# Patient Record
Sex: Female | Born: 1970
Health system: Southern US, Community
[De-identification: ages and names within clinical notes are randomized; demographics above are authoritative.]

## PROBLEM LIST (undated history)

## (undated) DIAGNOSIS — F319 Bipolar disorder, unspecified: Secondary | ICD-10-CM

## (undated) DIAGNOSIS — K449 Diaphragmatic hernia without obstruction or gangrene: Secondary | ICD-10-CM

## (undated) DIAGNOSIS — J302 Other seasonal allergic rhinitis: Secondary | ICD-10-CM

## (undated) DIAGNOSIS — E669 Obesity, unspecified: Secondary | ICD-10-CM

## (undated) DIAGNOSIS — F988 Other specified behavioral and emotional disorders with onset usually occurring in childhood and adolescence: Secondary | ICD-10-CM

## (undated) HISTORY — PX: CHOLECYSTECTOMY: SHX55

---

## 2009-07-21 ENCOUNTER — Emergency Department (HOSPITAL_COMMUNITY): Admission: EM | Admit: 2009-07-21 | Discharge: 2009-07-21 | Payer: Self-pay | Admitting: Emergency Medicine

## 2010-08-01 LAB — DIFFERENTIAL
Lymphs Abs: 2.1 10*3/uL (ref 0.7–4.0)
Monocytes Absolute: 0.4 10*3/uL (ref 0.1–1.0)
Monocytes Relative: 4 % (ref 3–12)
Neutrophils Relative %: 69 % (ref 43–77)

## 2010-08-01 LAB — POCT I-STAT, CHEM 8
BUN: 16 mg/dL (ref 6–23)
Calcium, Ion: 1.15 mmol/L (ref 1.12–1.32)
Chloride: 107 mEq/L (ref 96–112)
Creatinine, Ser: 0.8 mg/dL (ref 0.4–1.2)
Glucose, Bld: 110 mg/dL — ABNORMAL HIGH (ref 70–99)
Hemoglobin: 14.3 g/dL (ref 12.0–15.0)
Potassium: 3.9 mEq/L (ref 3.5–5.1)
Sodium: 140 mEq/L (ref 135–145)

## 2010-08-01 LAB — LIPASE, BLOOD: Lipase: 28 U/L (ref 11–59)

## 2010-08-01 LAB — CBC
HCT: 40.6 % (ref 36.0–46.0)
Hemoglobin: 14 g/dL (ref 12.0–15.0)
MCV: 92.2 fL (ref 78.0–100.0)
Platelets: 271 10*3/uL (ref 150–400)
RBC: 4.41 MIL/uL (ref 3.87–5.11)
WBC: 8.4 10*3/uL (ref 4.0–10.5)

## 2010-08-01 LAB — URINALYSIS, ROUTINE W REFLEX MICROSCOPIC: Ketones, ur: NEGATIVE mg/dL

## 2010-08-01 LAB — COMPREHENSIVE METABOLIC PANEL
ALT: 21 U/L (ref 0–35)
Alkaline Phosphatase: 82 U/L (ref 39–117)
BUN: 13 mg/dL (ref 6–23)
CO2: 24 mEq/L (ref 19–32)
GFR calc Af Amer: >60 mL/min (ref >60)
GFR calc non Af Amer: >60 mL/min (ref >60)
Sodium: 136 mEq/L (ref 135–145)

## 2014-10-10 ENCOUNTER — Encounter (HOSPITAL_COMMUNITY): Payer: Self-pay | Admitting: *Deleted

## 2014-10-10 ENCOUNTER — Emergency Department (HOSPITAL_COMMUNITY)
Admission: EM | Admit: 2014-10-10 | Discharge: 2014-10-10 | Disposition: A | Discharge status: Home Health | Payer: Self-pay | Attending: Emergency Medicine | Admitting: Emergency Medicine

## 2014-10-10 ENCOUNTER — Emergency Department (HOSPITAL_COMMUNITY): Payer: Self-pay

## 2014-10-10 DIAGNOSIS — Z8719 Personal history of other diseases of the digestive system: Secondary | ICD-10-CM | POA: Insufficient documentation

## 2014-10-10 DIAGNOSIS — Z3202 Encounter for pregnancy test, result negative: Secondary | ICD-10-CM | POA: Insufficient documentation

## 2014-10-10 DIAGNOSIS — F909 Attention-deficit hyperactivity disorder, unspecified type: Secondary | ICD-10-CM | POA: Insufficient documentation

## 2014-10-10 DIAGNOSIS — N83201 Unspecified ovarian cyst, right side: Secondary | ICD-10-CM

## 2014-10-10 DIAGNOSIS — Z720 Tobacco use: Secondary | ICD-10-CM | POA: Insufficient documentation

## 2014-10-10 DIAGNOSIS — E669 Obesity, unspecified: Secondary | ICD-10-CM | POA: Insufficient documentation

## 2014-10-10 DIAGNOSIS — Z79899 Other long term (current) drug therapy: Secondary | ICD-10-CM | POA: Insufficient documentation

## 2014-10-10 DIAGNOSIS — F319 Bipolar disorder, unspecified: Secondary | ICD-10-CM | POA: Insufficient documentation

## 2014-10-10 DIAGNOSIS — N8320 Unspecified ovarian cysts: Secondary | ICD-10-CM | POA: Insufficient documentation

## 2014-10-10 DIAGNOSIS — N12 Tubulo-interstitial nephritis, not specified as acute or chronic: Secondary | ICD-10-CM | POA: Insufficient documentation

## 2014-10-10 DIAGNOSIS — R109 Unspecified abdominal pain: Secondary | ICD-10-CM

## 2014-10-10 HISTORY — DX: Other seasonal allergic rhinitis: J30.2

## 2014-10-10 HISTORY — DX: Bipolar disorder, unspecified: F31.9

## 2014-10-10 HISTORY — DX: Obesity, unspecified: E66.9

## 2014-10-10 HISTORY — DX: Diaphragmatic hernia without obstruction or gangrene: K44.9

## 2014-10-10 HISTORY — DX: Other specified behavioral and emotional disorders with onset usually occurring in childhood and adolescence: F98.8

## 2014-10-10 LAB — CBC WITH DIFFERENTIAL/PLATELET
BASOS ABS: 0 10*3/uL (ref 0.0–0.1)
Basophils Relative: 0 % (ref 0–1)
Eosinophils Absolute: 0 10*3/uL (ref 0.0–0.7)
Eosinophils Relative: 0 % (ref 0–5)
HEMATOCRIT: 37.7 % (ref 36.0–46.0)
HEMOGLOBIN: 12.6 g/dL (ref 12.0–15.0)
LYMPHS ABS: 1.4 10*3/uL (ref 0.7–4.0)
LYMPHS PCT: 15 % (ref 12–46)
MCH: 29.9 pg (ref 26.0–34.0)
MCHC: 33.4 g/dL (ref 30.0–36.0)
MCV: 89.3 fL (ref 78.0–100.0)
Monocytes Absolute: 0.6 10*3/uL (ref 0.1–1.0)
Monocytes Relative: 6 % (ref 3–12)
NEUTROS ABS: 7.6 10*3/uL (ref 1.7–7.7)
Neutrophils Relative %: 79 % — ABNORMAL HIGH (ref 43–77)
Platelets: 299 10*3/uL (ref 150–400)
RBC: 4.22 MIL/uL (ref 3.87–5.11)
RDW: 12.8 % (ref 11.5–15.5)
WBC: 9.6 10*3/uL (ref 4.0–10.5)

## 2014-10-10 LAB — COMPREHENSIVE METABOLIC PANEL
ALBUMIN: 3.4 g/dL — AB (ref 3.5–5.0)
ALK PHOS: 81 U/L (ref 38–126)
ALT: 15 U/L (ref 14–54)
ANION GAP: 10 (ref 5–15)
AST: 16 U/L (ref 15–41)
BUN: 8 mg/dL (ref 6–20)
CO2: 23 mmol/L (ref 22–32)
CREATININE: 0.9 mg/dL (ref 0.44–1.00)
Calcium: 8.8 mg/dL — ABNORMAL LOW (ref 8.9–10.3)
Chloride: 102 mmol/L (ref 101–111)
GFR calc Af Amer: >60 mL/min (ref >60)
GLUCOSE: 116 mg/dL — AB (ref 65–99)
Potassium: 4 mmol/L (ref 3.5–5.1)
Sodium: 135 mmol/L (ref 135–145)
TOTAL PROTEIN: 7.2 g/dL (ref 6.5–8.1)
Total Bilirubin: 0.6 mg/dL (ref 0.3–1.2)

## 2014-10-10 LAB — URINE MICROSCOPIC-ADD ON

## 2014-10-10 LAB — URINALYSIS, ROUTINE W REFLEX MICROSCOPIC
Bilirubin Urine: NEGATIVE
Glucose, UA: NEGATIVE mg/dL
KETONES UR: NEGATIVE mg/dL
Nitrite: NEGATIVE
PROTEIN: NEGATIVE mg/dL
Specific Gravity, Urine: 1.009 (ref 1.005–1.030)
UROBILINOGEN UA: 0.2 mg/dL (ref 0.0–1.0)
pH: 6 (ref 5.0–8.0)

## 2014-10-10 LAB — POC URINE PREG, ED: PREG TEST UR: NEGATIVE

## 2014-10-10 MED ORDER — FLUCONAZOLE 150 MG PO TABS: 150.0000 mg | ORAL_TABLET | Freq: Once | ORAL | Status: AC

## 2014-10-10 MED ORDER — CEPHALEXIN 500 MG PO CAPS: 500.0000 mg | ORAL_CAPSULE | Freq: Four times a day (QID) | ORAL | Status: DC

## 2014-10-10 MED ORDER — ONDANSETRON HCL 4 MG/2ML IJ SOLN
4.0000 mg | Freq: Once | INTRAMUSCULAR | Status: AC
  Administered 2014-10-10: 4 mg via INTRAVENOUS
  Filled 2014-10-10: qty 2

## 2014-10-10 MED ORDER — ONDANSETRON 4 MG PO TBDP: ORAL_TABLET | ORAL | Status: AC

## 2014-10-10 MED ORDER — HYDROCODONE-ACETAMINOPHEN 5-325 MG PO TABS: 1.0000 | ORAL_TABLET | Freq: Four times a day (QID) | ORAL | Status: AC | PRN

## 2014-10-10 MED ORDER — MORPHINE SULFATE 4 MG/ML IJ SOLN
4.0000 mg | Freq: Once | INTRAMUSCULAR | Status: AC
  Administered 2014-10-10: 4 mg via INTRAVENOUS
  Filled 2014-10-10: qty 1

## 2014-10-10 MED ORDER — SODIUM CHLORIDE 0.9 % IV BOLUS (SEPSIS)
1000.0000 mL | Freq: Once | INTRAVENOUS | Status: AC
  Administered 2014-10-10: 1000 mL via INTRAVENOUS

## 2014-10-10 MED ORDER — DEXTROSE 5 % IV SOLN
1.0000 g | Freq: Once | INTRAVENOUS | Status: AC
  Administered 2014-10-10: 1 g via INTRAVENOUS
  Filled 2014-10-10: qty 10

## 2014-10-10 NOTE — Discharge Instructions (Signed)
1. Medications: zofran, vicodin, keflex, usual home medications 2. Treatment: rest, drink plenty of fluids, advance diet slowly 3. Follow Up: Please followup with your primary doctor in 2 days for discussion of your diagnoses and further evaluation after today's visit; if you do not have a primary care doctor use the resource guide provided to find one; please also follow-up with OB/GYN for further evaluation of your right ovarian cyst; Please return to the ER for persistent vomiting, high fevers or worsening symptoms   Pyelonephritis, Adult Pyelonephritis is a kidney infection. In general, there are 2 main types of pyelonephritis:  Infections that come on quickly without any warning (acute pyelonephritis).  Infections that persist for a long period of time (chronic pyelonephritis). CAUSES  Two main causes of pyelonephritis are:  Bacteria traveling from the bladder to the kidney. This is a problem especially in pregnant women. The urine in the bladder can become filled with bacteria from multiple causes, including:  Inflammation of the prostate gland (prostatitis).  Sexual intercourse in females.  Bladder infection (cystitis).  Bacteria traveling from the bloodstream to the tissue part of the kidney. Problems that may increase your risk of getting a kidney infection include:  Diabetes.  Kidney stones or bladder stones.  Cancer.  Catheters placed in the bladder.  Other abnormalities of the kidney or ureter. SYMPTOMS   Abdominal pain.  Pain in the side or flank area.  Fever.  Chills.  Upset stomach.  Blood in the urine (dark urine).  Frequent urination.  Strong or persistent urge to urinate.  Burning or stinging when urinating. DIAGNOSIS  Your caregiver may diagnose your kidney infection based on your symptoms. A urine sample may also be taken. TREATMENT  In general, treatment depends on how severe the infection is.   If the infection is mild and caught early,  your caregiver may treat you with oral antibiotics and send you home.  If the infection is more severe, the bacteria may have gotten into the bloodstream. This will require intravenous (IV) antibiotics and a hospital stay. Symptoms may include:  High fever.  Severe flank pain.  Shaking chills.  Even after a hospital stay, your caregiver may require you to be on oral antibiotics for a period of time.  Other treatments may be required depending upon the cause of the infection. HOME CARE INSTRUCTIONS   Take your antibiotics as directed. Finish them even if you start to feel better.  Make an appointment to have your urine checked to make sure the infection is gone.  Drink enough fluids to keep your urine clear or pale yellow.  Take medicines for the bladder if you have urgency and frequency of urination as directed by your caregiver. SEEK IMMEDIATE MEDICAL CARE IF:   You have a fever or persistent symptoms for more than 2-3 days.  You have a fever and your symptoms suddenly get worse.  You are unable to take your antibiotics or fluids.  You develop shaking chills.  You experience extreme weakness or fainting.  There is no improvement after 2 days of treatment. MAKE SURE YOU:  Understand these instructions.  Will watch your condition.  Will get help right away if you are not doing well or get worse. Document Released: 04/24/2005 Document Revised: 10/24/2011 Document Reviewed: 09/28/2010 [facility] Patient Information 2015 [place], [place]. This information is not intended to replace advice given to you by your health care provider. Make sure you discuss any questions you have with your health care provider.  Emergency Department Resource Guide 1) Find a Doctor and Pay Out of Pocket Although you won't have to find out who is covered by your insurance plan, it is a good idea to ask around and get recommendations. You will then need to call the office and see if the doctor you  have chosen will accept you as a new patient and what types of options they offer for patients who are self-pay. Some doctors offer discounts or will set up payment plans for their patients who do not have insurance, but you will need to ask so you aren't surprised when you get to your appointment.  2) Contact Your Local Health Department Not all health departments have doctors that can see patients for sick visits, but many do, so it is worth a call to see if yours does. If you don't know where your local health department is, you can check in your phone book. The CDC also has a tool to help you locate your state's health department, and many state websites also have listings of all of their local health departments.  3) Find a Walk-in Clinic If your illness is not likely to be very severe or complicated, you may want to try a walk in clinic. These are popping up all over the country in pharmacies, drugstores, and shopping centers. They're usually staffed by nurse practitioners or physician assistants that have been trained to treat common illnesses and complaints. They're usually fairly quick and inexpensive. However, if you have serious medical issues or chronic medical problems, these are probably not your best option.  No Primary Care Doctor: - Call Health Connect at  [phone] - they can help you locate a primary care doctor that  accepts your insurance, provides certain services, etc. - Physician Referral Service- [phone]  Chronic Pain Problems: Organization         Address  Phone   Notes  [facility]  [phone] Patients need to be referred by their primary care doctor.   Medication Assistance: Organization         Address  Phone   Notes  [facility] [address] [place], [place] [address] [phone]) [phone] --Must be a resident of [facility] [place] [facility] -- Must have NO insurance coverage whatsoever (no Medicaid/ Medicare,  etc.) -- The pt. MUST have a primary care doctor that directs their care regularly and follows them in the community   MedAssist  [phone]   [employer]  [phone]    Agencies that provide inexpensive medical care: Organization         Address  Phone   Notes  [facility]  [phone]   [facility]    [phone]   [facility] [address] [place], [place] [address] [phone]   [facility] [place] [address], [place] [phone]   Planned Parenthood    [phone]   [facility]    [phone]   [facility]  [address], [place] Phone:  [phone], Fax:  [phone] Hours of Operation:  9 am - 6 pm, M-F.  Also accepts Medicaid/Medicare and self-pay.  [facility]  [address], [place] Phone: [phone], Fax: [phone]) [phone]. Hours of Operation:  8:30 am - 5:30 pm, M-F.  Also accepts Medicaid and self-pay.  [facility]  [address], [employer] Phone: [phone]   [place], [place], [place] [phone], Ext. [phone] Mondays & Thursdays: 7-9 AM.  First 15 patients are seen on a first come, first serve basis.    [place] Providers:  Organization         Address  Phone   Notes  [facility] [address], [place] [phone] Also accepts self-pay patients.  [facility] [address], [place]  [phone]   [place], [address], [place] [phone]   [facility] [address] [place] [phone]   Lucianne Lei [address], [place]   [phone] Only accepts [place] Access [place] patients after they have their name applied to their card.   Self-Pay (no  insurance) in [facility]:  Organization         Address  Phone   Notes  Sickle Cell Patients, [facility] [place] [phone]   [facility] [place] [phone]   [facility] [place]  [place], [place], [place] [phone]   [facility]/Dr. Osei-Bonsu  [address], [place] or [address], [place] [phone] Phone number for both [place] and [place] locations is the same.  [facility] [address], [place] [phone]   [facility] [address] [place] or [address] [phone]   [facility] [address] [place] [phone], phone; [phone], fax Sees patients 1st and 3rd Saturday of every month.  Must not qualify for public or private insurance (i.e. Medicaid, Medicare, [place] Health Choice, Veterans' Benefits)  Household income should be no more than 200% of the poverty level The clinic cannot treat you if you are pregnant or think you are pregnant  Sexually transmitted diseases are not treated at the clinic.    Dental Care: Organization         Address  Phone  Notes  [facility] Department of [facility] [place] [phone] Accepts children up to age 59 who are enrolled in [place] or [place]; pregnant women with a Medicaid card; and children who have applied for Medicaid or [place] Health Choice, but were declined, whose parents can pay a reduced fee at time of service.  [facility] Department of [facility]  [address], [place] [phone] Accepts children up to age 4 who are enrolled in [place] or [place]; pregnant women with a Medicaid card; and children who have applied for Medicaid or [place] Health Choice, but were  declined, whose parents can pay a reduced fee at time of service.  [place] [facility] Access PRO[facility]  [place] [phone] Patients are seen by appointment only. Walk-ins are not accepted. [place] will see patients 62 years of age and older. Monday - Tuesday (8am-5pm) Most Wednesdays (8:30-5pm) $30 per visit, cash only  [facility]  [address], [facility] [phone] Patients are seen by appointment only. Walk-ins are not accepted. Peck will see patients 79 years of age and older.  One Wednesday Evening (Monthly: Volunteer Based).  $30 per visit, cash only  [place]  [phone] for adults; Children under age 57, call Graduate Pediatric Dentistry at [phone]. Children aged 30-14, please call [phone] to request a pediatric application.  Dental services are provided in all areas of dental care including fillings, crowns and bridges, complete and partial dentures, implants, gum treatment, root canals, and extractions. Preventive care is also provided. Treatment is provided to both adults and children. Patients are selected via a lottery and there is often a waiting list.   [facility] [address], [place]  [phone] [email or web address]   [facility] [address] [place], [place] [phone], Ext. [phone] Second and Fourth Thursday of each month, opens at 6:30 AM; Clinic ends at 9 AM.  Patients are seen on a first-come first-served basis, and a limited number are seen during each clinic.   [facility]  [address] Danker [place], [place] [phone]   Eligibility Requirements You must have lived in [place], [place], or [place] counties for at least the last three months.   You cannot be eligible for state or federal sponsored [employer], including [employer], [place], or [employer].   You generally cannot be  eligible for healthcare insurance through your employer.    How to apply: Eligibility screenings are held every Tuesday and Wednesday afternoon from 1:00 pm until 4:00 pm. You do not need an appointment for the interview!  [facility] [address] [place], [place]   [place]  [place] Department  [place]  [phone]    Behavioral Health Resources in the Community: Intensive Outpatient Programs Organization         Address  Phone  Notes  [place]. [address], [place], [place] [phone]   [facility] Outpatient [address], [place], [place]   ADS: Alcohol & Drug Svcs [address], [place], [place]   [place] [address],  [place], [place] or [phone]   Substance Abuse Resources Organization         Address  Phone  Notes  Alcohol and Drug Services  [phone]   [place]  [phone]   The [place]   Chinita Pester  [phone]   Residential & Outpatient Substance Abuse Program  [phone]   Psychological Services Organization         Address  Phone  Notes  [facility] [place]  [place]  [phone]   [place] [address] [place] or [phone]    Mobile Crisis Teams Organization         Address  Phone  Notes  [facility], Mobile Crisis Care Unit  [phone]   [facility]  [address] [place], [place]   [place] Levels [address] [place] [phone]    Self-Help/Support Groups Organization         Address  Phone             Notes  [place]. of [place] - variety of support groups  [place] Call for more information    Narcotics Anonymous (NA), [facility] [address], [employer] [place]  2 meetings at  this location   Residential Treatment Programs Organization         Address  Phone  Notes  [facility] [address]    [place]  [phone]   [facility]  [address], [place] [address], [place], [place]   [place], [place] [phone] Admissions: 8am-3pm M-F  Incentives Substance [place] [address]    [place], [place] [phone]   [facility] [address] [place], [place], [place]   [facility] [address]  [place], [place]   [facility]ight Programs - Intensive Outpatient [address], [place], [place]   [facility] ([address] [place].,  [place], [place] [phone] or [phone]   [facility]idential Treatment Services (RTS) [address], [place], [place] Accepts Medicaid  [facility] [place] [address],  [place] [phone] Substance Abuse/Addiction Treatment   [facility] Organization         Address  Phone  Notes  [facility]  [phone]   Domenic Schwab, PhD [address] Arlis Porta [place], [place]   [phone] or [phone]   [place], [place] [phone]   [facility] [address], [place], [place] [phone] Insurance/Medicaid/sponsorship through [facility] and Families [address], [address]                                    [place], [place] [phone] [place] [address], [place] [phone]    Dr. Adele Schilder  [phone]   [facility] Dept. 1) [address], [place] 2) [place] 3)  [place] 65, [place] [phone]  [phone]   [place] [phone] or [phone] (After Hours)

## 2014-10-10 NOTE — ED Provider Notes (Signed)
CSN: [id number]     Arrival date & time 10/10/14  1456 History   First MD Initiated Contact with Patient 10/10/14 1641     Chief Complaint  Patient presents with  . Flank Pain  . Fever     (Consider location/radiation/quality/duration/timing/severity/associated sxs/prior Treatment) The history is provided by the patient and medical records. No language interpreter was used.     Hayley White is a 44 y.o. female  with a hx of obesity, ADD, Bipolar disorder presents to the Emergency Department complaining of gradual, persistent, progressively worsening bilateral flank pain onset 2-3 days ago.  Pt reports she began with UTI symptoms 5 days ago - dysuria, urinary frequency and urinary urgency.  She reports drinking water, taking cranberry extract and using urostat without relief. Associated symptoms include fever (low grade to 100.3) x 2days, suprapubic abdominal pain, nausea and chills.  Pt reports she does not frequently get UTIs.  Nothing makes it better and nothing makes it worse.  Pt denies Hx of kidney stones.  Pt also denies headache, neck pain, chest pain, shortness of breath, noting, diarrhea, weakness, dizziness, syncope.     Past Medical History  Diagnosis Date  . Obesity   . Bipolar 1 disorder   . Hiatal hernia   . Seasonal allergies   . ADD (attention deficit disorder)    History reviewed. No pertinent past surgical history. History reviewed. No pertinent family history. History  Substance Use Topics  . Smoking status: Current Every Day Smoker    Types: Cigarettes  . Smokeless tobacco: Not on file  . Alcohol Use: Yes   OB History    No data available     Review of Systems  Constitutional: Negative for fever, diaphoresis, appetite change and fatigue.  Respiratory: Negative for shortness of breath.   Cardiovascular: Negative for chest pain.  Gastrointestinal: Positive for nausea and abdominal pain. Negative for vomiting, diarrhea, constipation and blood in stool.    Genitourinary: Positive for urgency, frequency, hematuria and flank pain. Negative for dysuria and difficulty urinating.  Musculoskeletal: Negative for back pain.  Skin: Negative for rash.  Neurological: Negative for headaches.  All other systems reviewed and are negative.     Allergies  Review of patient's allergies indicates no known allergies.  Home Medications   Prior to Admission medications   Medication Sig Start Date End Date Taking? Authorizing Provider  buPROPion (WELLBUTRIN SR) 200 MG 12 hr tablet Take 200 mg by mouth 2 (two) times daily. Take two times a day  Per patient   Yes Historical Provider, MD  cloNIDine (CATAPRES) 0.2 MG tablet Take 0.2 mg by mouth at bedtime.    Yes Historical Provider, MD  CRANBERRY PO Take 8 tablets by mouth daily. Take 8 tablets every day per patient   Yes Historical Provider, MD  diphenhydrAMINE (BENADRYL) 25 mg capsule Take 25 mg by mouth at bedtime. Take every day per patient   Yes Historical Provider, MD  Ibuprofen 200 MG CAPS Take 1 capsule by mouth daily.   Yes Historical Provider, MD  lamoTRIgine (LAMICTAL) 150 MG tablet Take 150 mg by mouth daily.   Yes Historical Provider, MD  loratadine (CLARITIN) 10 MG tablet Take 10 mg by mouth daily.   Yes Historical Provider, MD  omeprazole (PRILOSEC) 20 MG capsule Take 20 mg by mouth daily.   Yes Historical Provider, MD  pseudoephedrine (SUDAFED) 120 MG 12 hr tablet Take 120 mg by mouth daily.   Yes Historical Provider, MD  traZODone (DESYREL)  50 MG tablet Take 50 mg by mouth at bedtime.   Yes Historical Provider, MD  cephALEXin (KEFLEX) 500 MG capsule Take 1 capsule (500 mg total) by mouth 4 (four) times daily. 10/10/14   Gay Rape, PA-C  HYDROcodone-acetaminophen (NORCO/VICODIN) 5-325 MG per tablet Take 1-2 tablets by mouth every 6 (six) hours as needed for moderate pain or severe pain. 10/10/14   Agnieszka Newhouse, PA-C  ondansetron (ZOFRAN ODT) 4 MG disintegrating tablet 4mg  ODT q4 hours  prn nausea/vomit 10/10/14   Vonda Harth, PA-C   BP 120/67 mmHg  Pulse 84  Temp(Src) 100 F (37.8 C) (Oral)  Resp 18  Ht 5\' 5"  (1.651 m)  Wt 275 lb (124.739 kg)  BMI 45.76 kg/m2  SpO2 100%  LMP  (LMP Unknown) Physical Exam  Constitutional: She appears well-developed and well-nourished. No distress.  Awake, alert, nontoxic appearance  HENT:  Head: Normocephalic and atraumatic.  Mouth/Throat: Oropharynx is clear and moist. No oropharyngeal exudate.  Eyes: Conjunctivae are normal. No scleral icterus.  Neck: Normal range of motion. Neck supple.  Cardiovascular: Normal rate, regular rhythm, normal heart sounds and intact distal pulses.   Pulmonary/Chest: Effort normal and breath sounds normal. No respiratory distress. She has no wheezes.  Equal chest expansion  Abdominal: Soft. Bowel sounds are normal. She exhibits no distension and no mass. There is tenderness in the right lower quadrant, suprapubic area and left lower quadrant. There is no rebound, no guarding and no CVA tenderness.    Lower abdominal tenderness worse in the suprapubic region  Musculoskeletal: Normal range of motion. She exhibits no edema.  Neurological: She is alert.  Speech is clear and goal oriented Moves extremities without ataxia  Skin: Skin is warm and dry. No rash noted. She is not diaphoretic.  Psychiatric: She has a normal mood and affect.  Nursing note and vitals reviewed.   ED Course  Procedures (including critical care time) Labs Review Labs Reviewed  CBC WITH DIFFERENTIAL/PLATELET - Abnormal; Notable for the following:    Neutrophils Relative % 79 (*)    All other components within normal limits  COMPREHENSIVE METABOLIC PANEL - Abnormal; Notable for the following:    Glucose, Bld 116 (*)    Calcium 8.8 (*)    Albumin 3.4 (*)    All other components within normal limits  URINALYSIS, ROUTINE W REFLEX MICROSCOPIC (NOT AT [facility]) - Abnormal; Notable for the following:    APPearance CLOUDY (*)     Hgb urine dipstick SMALL (*)    Leukocytes, UA LARGE (*)    All other components within normal limits  URINE MICROSCOPIC-ADD ON - Abnormal; Notable for the following:    Bacteria, UA FEW (*)    All other components within normal limits  URINE CULTURE  POC URINE PREG, ED    Imaging Review Ct Renal Stone Study  10/10/2014   CLINICAL DATA:  Right-sided flank pain for several days. Cholelithiasis.  EXAM: CT ABDOMEN AND PELVIS WITHOUT CONTRAST  TECHNIQUE: Multidetector CT imaging of the abdomen and pelvis was performed following the standard protocol without IV contrast.  COMPARISON:  07/21/2009  FINDINGS: Lower chest:  Unremarkable.  Hepatobiliary: No mass visualized on this unenhanced exam. Several gallstones are again noted, however there is no evidence cholecystitis or biliary dilatation.  Pancreas: No mass or inflammatory process visualized on this unenhanced exam.  Spleen: No evidence of splenomegaly. Several low-attenuation splenic lesions appear stable since 2011 exam, consistent with benign etiology.  Adrenal Glands:  No masses identified.  Kidneys/Urinary tract: No evidence of urolithiasis or hydronephrosis. Mild asymmetric perinephric stranding is seen adjacent to the lower pole of the right kidney which is new since previous study, and suspicious for pyelonephritis.  Stomach/Bowel/Peritoneum:  Unremarkable.  Vascular/Lymphatic: No pathologically enlarged lymph nodes identified. No other significant abnormality identified.  Reproductive: IUD remains in expected position, and nuva ring noted in the vagina. A new 3.5 cm right ovarian lesion is seen which measures higher than fluid attenuation and may contain a septation. These features are nonspecific.  Other:  None.  Musculoskeletal:  No suspicious bone lesions identified.  IMPRESSION: No evidence of urolithiasis or hydronephrosis.  Mild asymmetric perinephric stranding adjacent to lower pole of right kidney, raising suspicion for pyelonephritis.  Recommend correlation with urinalysis.  3.5 cm nonspecific low-attenuation lesion in the right ovary which is new or increased since previous study. Pelvic ultrasound is recommended for further evaluation.  Cholelithiasis.  No radiographic evidence of cholecystitis.   Electronically Signed   By: Myles Rosenthal M.D.   On: 10/10/2014 18:55     EKG Interpretation None      MDM   Final diagnoses:  Flank pain  Pyelonephritis  Right ovarian cyst   Vivyan Biggers presents with UTI symptoms now progressing to flank pain, low-grade fever, nausea and bilateral lower abdominal pain. Likely pyelonephritis. Patient is not vomiting. Will obtain CT renal rule out kidney stone.  Patient has family history of same but has never had one herself.    7:44 PM Pt has been diagnosed with a UTI on UA. Pt has low-grade fever but is without CVA tenderness, is normotensive, and denies N/V. Her pain is well controlled here in the emergency department. CT renal shows right pyelonephritis and no evidence of kidney stone. Incidental finding of right ovarian cyst. Patient is without lateralizing pain. On repeat abdominal exam she has a soft and nontender abdomen. No peritoneal signs or rebound. Patient is nontoxic, nonseptic appearing, in no apparent distress.  Patient's pain and other symptoms adequately managed in emergency department.  Fluid bolus given.  Patient does not meet the SIRS or Sepsis criteria.  No indication of appendicitis, bowel obstruction, bowel perforation, cholecystitis, diverticulitis, PID or ectopic pregnancy.  Patient given Rocephin here in the emergency department. Pt to be dc home with antibiotics and instructions to follow up with OB/GYN for further evaluation of her ovarian cyst. Strict return precautions given.    BP 120/67 mmHg  Pulse 84  Temp(Src) 100 F (37.8 C) (Oral)  Resp 18  Ht  (1.651 m)  Wt 275 lb (124.739 kg)  BMI 45.76 kg/m2  SpO2 100%  LMP  (LMP Unknown)    Dierdre Forth, PA-C 10/10/14 1944  Richardean Canal, MD 10/10/14 [phone]

## 2014-10-10 NOTE — ED Notes (Signed)
Pt reports having urinary symptoms x 1 week, now has right side back pain and fever/fatigue.

## 2014-10-10 NOTE — ED Notes (Signed)
States she feels better. IV d/c'd

## 2014-10-10 NOTE — ED Notes (Signed)
Has taken azo and advil at home without relief

## 2014-10-10 NOTE — ED Notes (Signed)
MD at bedside. 

## 2014-10-13 LAB — URINE CULTURE: Colony Count: >=100000

## 2014-10-14 ENCOUNTER — Telehealth (HOSPITAL_BASED_OUTPATIENT_CLINIC_OR_DEPARTMENT_OTHER): Payer: Self-pay | Admitting: Emergency Medicine

## 2014-10-14 NOTE — Telephone Encounter (Signed)
Post ED Visit - Positive Culture Follow-up  Culture report reviewed by antimicrobial stewardship pharmacist: []  Wes Dulaney, Pharm.D., BCPS []  Celedonio MiyamotoJeremy Frens, Pharm.D., BCPS [x]  Georgina PillionElizabeth Martin, [address] BCPS []  [place] Hill[place]h Pham, [place]arm[place]D., BCPS, AAHIVP [x]  Estella HuskMichelle Gropp, Pharm.D., BCPS, AAHIVP []  Elder CyphersLorie Poole, [address]Ph[address]D., BCPS  Positive urine culture E. coli Treated with cephalexin, fluconazole, organism sensitive to the same and no further patient follow-up is required at this time.  Berle MullMiller, [place]ley 10/14/2014, 10:25 AM

## 2015-11-26 DIAGNOSIS — F4323 Adjustment disorder with mixed anxiety and depressed mood: Secondary | ICD-10-CM | POA: Diagnosis not present

## 2015-12-06 DIAGNOSIS — R1084 Generalized abdominal pain: Secondary | ICD-10-CM | POA: Diagnosis not present

## 2015-12-06 DIAGNOSIS — R252 Cramp and spasm: Secondary | ICD-10-CM | POA: Diagnosis not present

## 2015-12-06 DIAGNOSIS — K8020 Calculus of gallbladder without cholecystitis without obstruction: Secondary | ICD-10-CM | POA: Diagnosis not present

## 2015-12-07 DIAGNOSIS — F4323 Adjustment disorder with mixed anxiety and depressed mood: Secondary | ICD-10-CM | POA: Diagnosis not present

## 2015-12-12 DIAGNOSIS — J209 Acute bronchitis, unspecified: Secondary | ICD-10-CM | POA: Diagnosis not present

## 2015-12-13 DIAGNOSIS — F4323 Adjustment disorder with mixed anxiety and depressed mood: Secondary | ICD-10-CM | POA: Diagnosis not present

## 2015-12-14 ENCOUNTER — Other Ambulatory Visit: Payer: Self-pay | Admitting: Obstetrics & Gynecology

## 2015-12-14 ENCOUNTER — Other Ambulatory Visit (HOSPITAL_COMMUNITY)
Admission: RE | Admit: 2015-12-14 | Discharge: 2015-12-14 | Disposition: A | Discharge status: Home / Self Care | Payer: BLUE CROSS/BLUE SHIELD | Source: Ambulatory Visit | Attending: Obstetrics & Gynecology | Admitting: Obstetrics & Gynecology

## 2015-12-14 DIAGNOSIS — Z113 Encounter for screening for infections with a predominantly sexual mode of transmission: Secondary | ICD-10-CM | POA: Diagnosis not present

## 2015-12-14 DIAGNOSIS — Z1151 Encounter for screening for human papillomavirus (HPV): Secondary | ICD-10-CM | POA: Diagnosis not present

## 2015-12-14 DIAGNOSIS — Z01419 Encounter for gynecological examination (general) (routine) without abnormal findings: Secondary | ICD-10-CM | POA: Diagnosis not present

## 2015-12-14 DIAGNOSIS — Z01411 Encounter for gynecological examination (general) (routine) with abnormal findings: Secondary | ICD-10-CM | POA: Diagnosis not present

## 2015-12-14 DIAGNOSIS — Z30431 Encounter for routine checking of intrauterine contraceptive device: Secondary | ICD-10-CM | POA: Diagnosis not present

## 2015-12-14 DIAGNOSIS — N939 Abnormal uterine and vaginal bleeding, unspecified: Secondary | ICD-10-CM | POA: Diagnosis not present

## 2015-12-15 ENCOUNTER — Other Ambulatory Visit: Payer: Self-pay | Admitting: Obstetrics & Gynecology

## 2015-12-15 DIAGNOSIS — Z1231 Encounter for screening mammogram for malignant neoplasm of breast: Secondary | ICD-10-CM

## 2015-12-15 DIAGNOSIS — F418 Other specified anxiety disorders: Secondary | ICD-10-CM | POA: Diagnosis not present

## 2015-12-15 DIAGNOSIS — F900 Attention-deficit hyperactivity disorder, predominantly inattentive type: Secondary | ICD-10-CM | POA: Diagnosis not present

## 2015-12-15 LAB — CYTOLOGY - PAP

## 2015-12-17 ENCOUNTER — Ambulatory Visit: Payer: Self-pay

## 2015-12-21 DIAGNOSIS — F4323 Adjustment disorder with mixed anxiety and depressed mood: Secondary | ICD-10-CM | POA: Diagnosis not present

## 2015-12-27 ENCOUNTER — Other Ambulatory Visit: Payer: Self-pay | Admitting: Physician Assistant

## 2015-12-27 DIAGNOSIS — R109 Unspecified abdominal pain: Secondary | ICD-10-CM | POA: Diagnosis not present

## 2015-12-27 DIAGNOSIS — K8020 Calculus of gallbladder without cholecystitis without obstruction: Secondary | ICD-10-CM | POA: Diagnosis not present

## 2015-12-27 DIAGNOSIS — Z113 Encounter for screening for infections with a predominantly sexual mode of transmission: Secondary | ICD-10-CM | POA: Diagnosis not present

## 2015-12-28 ENCOUNTER — Ambulatory Visit
Admission: RE | Admit: 2015-12-28 | Discharge: 2015-12-28 | Disposition: A | Discharge status: Home / Self Care | Payer: BLUE CROSS/BLUE SHIELD | Source: Ambulatory Visit | Attending: Obstetrics & Gynecology | Admitting: Obstetrics & Gynecology

## 2015-12-28 DIAGNOSIS — Z1231 Encounter for screening mammogram for malignant neoplasm of breast: Secondary | ICD-10-CM | POA: Diagnosis not present

## 2015-12-29 ENCOUNTER — Other Ambulatory Visit: Payer: Self-pay | Admitting: Obstetrics & Gynecology

## 2015-12-29 DIAGNOSIS — R928 Other abnormal and inconclusive findings on diagnostic imaging of breast: Secondary | ICD-10-CM

## 2016-01-03 ENCOUNTER — Ambulatory Visit
Admission: RE | Admit: 2016-01-03 | Discharge: 2016-01-03 | Disposition: A | Discharge status: Home / Self Care | Payer: BLUE CROSS/BLUE SHIELD | Source: Ambulatory Visit | Attending: Obstetrics & Gynecology | Admitting: Obstetrics & Gynecology

## 2016-01-03 DIAGNOSIS — R928 Other abnormal and inconclusive findings on diagnostic imaging of breast: Secondary | ICD-10-CM | POA: Diagnosis not present

## 2016-01-03 DIAGNOSIS — N63 Unspecified lump in breast: Secondary | ICD-10-CM | POA: Diagnosis not present

## 2016-01-05 ENCOUNTER — Ambulatory Visit
Admission: RE | Admit: 2016-01-05 | Discharge: 2016-01-05 | Disposition: A | Discharge status: Home / Self Care | Payer: BLUE CROSS/BLUE SHIELD | Source: Ambulatory Visit | Attending: Physician Assistant | Admitting: Physician Assistant

## 2016-01-05 DIAGNOSIS — K8020 Calculus of gallbladder without cholecystitis without obstruction: Secondary | ICD-10-CM

## 2016-01-11 DIAGNOSIS — K8020 Calculus of gallbladder without cholecystitis without obstruction: Secondary | ICD-10-CM | POA: Diagnosis not present

## 2016-01-12 ENCOUNTER — Other Ambulatory Visit: Payer: Self-pay | Admitting: Obstetrics & Gynecology

## 2016-01-12 DIAGNOSIS — Z3202 Encounter for pregnancy test, result negative: Secondary | ICD-10-CM | POA: Diagnosis not present

## 2016-01-12 DIAGNOSIS — N840 Polyp of corpus uteri: Secondary | ICD-10-CM | POA: Diagnosis not present

## 2016-01-12 DIAGNOSIS — N939 Abnormal uterine and vaginal bleeding, unspecified: Secondary | ICD-10-CM | POA: Diagnosis not present

## 2016-01-12 DIAGNOSIS — Z30433 Encounter for removal and reinsertion of intrauterine contraceptive device: Secondary | ICD-10-CM | POA: Diagnosis not present

## 2016-01-17 DIAGNOSIS — L02415 Cutaneous abscess of right lower limb: Secondary | ICD-10-CM | POA: Diagnosis not present

## 2016-01-17 DIAGNOSIS — L0291 Cutaneous abscess, unspecified: Secondary | ICD-10-CM | POA: Diagnosis not present

## 2016-01-17 DIAGNOSIS — S80261A Insect bite (nonvenomous), right knee, initial encounter: Secondary | ICD-10-CM | POA: Diagnosis not present

## 2016-01-19 DIAGNOSIS — F411 Generalized anxiety disorder: Secondary | ICD-10-CM | POA: Diagnosis not present

## 2016-01-19 DIAGNOSIS — F410 Panic disorder [episodic paroxysmal anxiety] without agoraphobia: Secondary | ICD-10-CM | POA: Diagnosis not present

## 2016-01-19 DIAGNOSIS — F900 Attention-deficit hyperactivity disorder, predominantly inattentive type: Secondary | ICD-10-CM | POA: Diagnosis not present

## 2016-01-19 DIAGNOSIS — F3130 Bipolar disorder, current episode depressed, mild or moderate severity, unspecified: Secondary | ICD-10-CM | POA: Diagnosis not present

## 2016-01-26 DIAGNOSIS — Z23 Encounter for immunization: Secondary | ICD-10-CM | POA: Diagnosis not present

## 2016-02-04 DIAGNOSIS — F411 Generalized anxiety disorder: Secondary | ICD-10-CM | POA: Diagnosis not present

## 2016-02-17 DIAGNOSIS — F3189 Other bipolar disorder: Secondary | ICD-10-CM | POA: Diagnosis not present

## 2016-02-18 DIAGNOSIS — F411 Generalized anxiety disorder: Secondary | ICD-10-CM | POA: Diagnosis not present

## 2016-02-21 ENCOUNTER — Other Ambulatory Visit: Payer: Self-pay | Admitting: General Surgery

## 2016-02-21 DIAGNOSIS — K8010 Calculus of gallbladder with chronic cholecystitis without obstruction: Secondary | ICD-10-CM | POA: Diagnosis not present

## 2016-02-21 DIAGNOSIS — K8020 Calculus of gallbladder without cholecystitis without obstruction: Secondary | ICD-10-CM | POA: Diagnosis not present

## 2016-02-26 DIAGNOSIS — N390 Urinary tract infection, site not specified: Secondary | ICD-10-CM | POA: Diagnosis not present

## 2016-02-26 DIAGNOSIS — B373 Candidiasis of vulva and vagina: Secondary | ICD-10-CM | POA: Diagnosis not present

## 2016-02-29 DIAGNOSIS — N939 Abnormal uterine and vaginal bleeding, unspecified: Secondary | ICD-10-CM | POA: Diagnosis not present

## 2016-02-29 DIAGNOSIS — Z30431 Encounter for routine checking of intrauterine contraceptive device: Secondary | ICD-10-CM | POA: Diagnosis not present

## 2016-03-02 DIAGNOSIS — F411 Generalized anxiety disorder: Secondary | ICD-10-CM | POA: Diagnosis not present

## 2016-03-27 DIAGNOSIS — L03311 Cellulitis of abdominal wall: Secondary | ICD-10-CM | POA: Diagnosis not present

## 2016-03-29 DIAGNOSIS — F411 Generalized anxiety disorder: Secondary | ICD-10-CM | POA: Diagnosis not present

## 2016-03-29 DIAGNOSIS — F3176 Bipolar disorder, in full remission, most recent episode depressed: Secondary | ICD-10-CM | POA: Diagnosis not present

## 2016-03-29 DIAGNOSIS — F3174 Bipolar disorder, in full remission, most recent episode manic: Secondary | ICD-10-CM | POA: Diagnosis not present

## 2016-03-29 DIAGNOSIS — F900 Attention-deficit hyperactivity disorder, predominantly inattentive type: Secondary | ICD-10-CM | POA: Diagnosis not present

## 2016-03-31 DIAGNOSIS — L02211 Cutaneous abscess of abdominal wall: Secondary | ICD-10-CM | POA: Diagnosis not present

## 2016-04-12 DIAGNOSIS — L089 Local infection of the skin and subcutaneous tissue, unspecified: Secondary | ICD-10-CM | POA: Diagnosis not present

## 2016-04-12 DIAGNOSIS — R42 Dizziness and giddiness: Secondary | ICD-10-CM | POA: Diagnosis not present

## 2016-04-14 DIAGNOSIS — F321 Major depressive disorder, single episode, moderate: Secondary | ICD-10-CM | POA: Diagnosis not present

## 2016-04-27 DIAGNOSIS — R7301 Impaired fasting glucose: Secondary | ICD-10-CM | POA: Diagnosis not present

## 2016-06-20 DIAGNOSIS — J029 Acute pharyngitis, unspecified: Secondary | ICD-10-CM | POA: Diagnosis not present

## 2016-08-21 DIAGNOSIS — R319 Hematuria, unspecified: Secondary | ICD-10-CM | POA: Diagnosis not present

## 2016-08-21 DIAGNOSIS — R3129 Other microscopic hematuria: Secondary | ICD-10-CM | POA: Diagnosis not present

## 2016-09-20 DIAGNOSIS — F4322 Adjustment disorder with anxiety: Secondary | ICD-10-CM | POA: Diagnosis not present

## 2016-09-20 DIAGNOSIS — F900 Attention-deficit hyperactivity disorder, predominantly inattentive type: Secondary | ICD-10-CM | POA: Diagnosis not present

## 2016-09-20 DIAGNOSIS — F3174 Bipolar disorder, in full remission, most recent episode manic: Secondary | ICD-10-CM | POA: Diagnosis not present

## 2016-09-20 DIAGNOSIS — F3176 Bipolar disorder, in full remission, most recent episode depressed: Secondary | ICD-10-CM | POA: Diagnosis not present

## 2016-10-19 DIAGNOSIS — R351 Nocturia: Secondary | ICD-10-CM | POA: Diagnosis not present

## 2016-10-19 DIAGNOSIS — R310 Gross hematuria: Secondary | ICD-10-CM | POA: Diagnosis not present

## 2016-10-27 DIAGNOSIS — R310 Gross hematuria: Secondary | ICD-10-CM | POA: Diagnosis not present

## 2016-10-27 DIAGNOSIS — R319 Hematuria, unspecified: Secondary | ICD-10-CM | POA: Diagnosis not present

## 2016-11-13 DIAGNOSIS — R350 Frequency of micturition: Secondary | ICD-10-CM | POA: Diagnosis not present

## 2016-11-13 DIAGNOSIS — R310 Gross hematuria: Secondary | ICD-10-CM | POA: Diagnosis not present

## 2016-11-14 DIAGNOSIS — M25551 Pain in right hip: Secondary | ICD-10-CM | POA: Diagnosis not present

## 2016-11-14 DIAGNOSIS — M9905 Segmental and somatic dysfunction of pelvic region: Secondary | ICD-10-CM | POA: Diagnosis not present

## 2016-11-14 DIAGNOSIS — M4003 Postural kyphosis, cervicothoracic region: Secondary | ICD-10-CM | POA: Diagnosis not present

## 2016-11-14 DIAGNOSIS — M9901 Segmental and somatic dysfunction of cervical region: Secondary | ICD-10-CM | POA: Diagnosis not present

## 2016-11-15 DIAGNOSIS — M9901 Segmental and somatic dysfunction of cervical region: Secondary | ICD-10-CM | POA: Diagnosis not present

## 2016-11-15 DIAGNOSIS — M9902 Segmental and somatic dysfunction of thoracic region: Secondary | ICD-10-CM | POA: Diagnosis not present

## 2016-11-15 DIAGNOSIS — M9905 Segmental and somatic dysfunction of pelvic region: Secondary | ICD-10-CM | POA: Diagnosis not present

## 2016-11-15 DIAGNOSIS — M4003 Postural kyphosis, cervicothoracic region: Secondary | ICD-10-CM | POA: Diagnosis not present

## 2016-11-16 DIAGNOSIS — M9905 Segmental and somatic dysfunction of pelvic region: Secondary | ICD-10-CM | POA: Diagnosis not present

## 2016-11-16 DIAGNOSIS — M25551 Pain in right hip: Secondary | ICD-10-CM | POA: Diagnosis not present

## 2016-11-16 DIAGNOSIS — M4003 Postural kyphosis, cervicothoracic region: Secondary | ICD-10-CM | POA: Diagnosis not present

## 2016-11-16 DIAGNOSIS — M9901 Segmental and somatic dysfunction of cervical region: Secondary | ICD-10-CM | POA: Diagnosis not present

## 2016-11-20 DIAGNOSIS — M9901 Segmental and somatic dysfunction of cervical region: Secondary | ICD-10-CM | POA: Diagnosis not present

## 2016-11-20 DIAGNOSIS — M9905 Segmental and somatic dysfunction of pelvic region: Secondary | ICD-10-CM | POA: Diagnosis not present

## 2016-11-20 DIAGNOSIS — M4003 Postural kyphosis, cervicothoracic region: Secondary | ICD-10-CM | POA: Diagnosis not present

## 2016-11-20 DIAGNOSIS — M25551 Pain in right hip: Secondary | ICD-10-CM | POA: Diagnosis not present

## 2016-11-21 DIAGNOSIS — M9901 Segmental and somatic dysfunction of cervical region: Secondary | ICD-10-CM | POA: Diagnosis not present

## 2016-11-21 DIAGNOSIS — M4003 Postural kyphosis, cervicothoracic region: Secondary | ICD-10-CM | POA: Diagnosis not present

## 2016-11-21 DIAGNOSIS — M9905 Segmental and somatic dysfunction of pelvic region: Secondary | ICD-10-CM | POA: Diagnosis not present

## 2016-11-21 DIAGNOSIS — M25551 Pain in right hip: Secondary | ICD-10-CM | POA: Diagnosis not present

## 2016-11-23 DIAGNOSIS — M25551 Pain in right hip: Secondary | ICD-10-CM | POA: Diagnosis not present

## 2016-11-23 DIAGNOSIS — M9901 Segmental and somatic dysfunction of cervical region: Secondary | ICD-10-CM | POA: Diagnosis not present

## 2016-11-23 DIAGNOSIS — M9905 Segmental and somatic dysfunction of pelvic region: Secondary | ICD-10-CM | POA: Diagnosis not present

## 2016-11-23 DIAGNOSIS — M4003 Postural kyphosis, cervicothoracic region: Secondary | ICD-10-CM | POA: Diagnosis not present

## 2016-11-27 DIAGNOSIS — M9905 Segmental and somatic dysfunction of pelvic region: Secondary | ICD-10-CM | POA: Diagnosis not present

## 2016-11-27 DIAGNOSIS — M25551 Pain in right hip: Secondary | ICD-10-CM | POA: Diagnosis not present

## 2016-11-27 DIAGNOSIS — M4003 Postural kyphosis, cervicothoracic region: Secondary | ICD-10-CM | POA: Diagnosis not present

## 2016-11-27 DIAGNOSIS — M9901 Segmental and somatic dysfunction of cervical region: Secondary | ICD-10-CM | POA: Diagnosis not present

## 2016-11-28 DIAGNOSIS — M9905 Segmental and somatic dysfunction of pelvic region: Secondary | ICD-10-CM | POA: Diagnosis not present

## 2016-11-28 DIAGNOSIS — M9901 Segmental and somatic dysfunction of cervical region: Secondary | ICD-10-CM | POA: Diagnosis not present

## 2016-11-28 DIAGNOSIS — M25551 Pain in right hip: Secondary | ICD-10-CM | POA: Diagnosis not present

## 2016-11-28 DIAGNOSIS — M4003 Postural kyphosis, cervicothoracic region: Secondary | ICD-10-CM | POA: Diagnosis not present

## 2016-11-30 DIAGNOSIS — M9905 Segmental and somatic dysfunction of pelvic region: Secondary | ICD-10-CM | POA: Diagnosis not present

## 2016-11-30 DIAGNOSIS — M4003 Postural kyphosis, cervicothoracic region: Secondary | ICD-10-CM | POA: Diagnosis not present

## 2016-11-30 DIAGNOSIS — M25551 Pain in right hip: Secondary | ICD-10-CM | POA: Diagnosis not present

## 2016-11-30 DIAGNOSIS — M9901 Segmental and somatic dysfunction of cervical region: Secondary | ICD-10-CM | POA: Diagnosis not present

## 2016-12-04 DIAGNOSIS — M9905 Segmental and somatic dysfunction of pelvic region: Secondary | ICD-10-CM | POA: Diagnosis not present

## 2016-12-04 DIAGNOSIS — M4003 Postural kyphosis, cervicothoracic region: Secondary | ICD-10-CM | POA: Diagnosis not present

## 2016-12-04 DIAGNOSIS — M25551 Pain in right hip: Secondary | ICD-10-CM | POA: Diagnosis not present

## 2016-12-04 DIAGNOSIS — M9901 Segmental and somatic dysfunction of cervical region: Secondary | ICD-10-CM | POA: Diagnosis not present

## 2016-12-05 DIAGNOSIS — M25551 Pain in right hip: Secondary | ICD-10-CM | POA: Diagnosis not present

## 2016-12-05 DIAGNOSIS — M9905 Segmental and somatic dysfunction of pelvic region: Secondary | ICD-10-CM | POA: Diagnosis not present

## 2016-12-05 DIAGNOSIS — M9901 Segmental and somatic dysfunction of cervical region: Secondary | ICD-10-CM | POA: Diagnosis not present

## 2016-12-05 DIAGNOSIS — M4003 Postural kyphosis, cervicothoracic region: Secondary | ICD-10-CM | POA: Diagnosis not present

## 2016-12-11 DIAGNOSIS — M9901 Segmental and somatic dysfunction of cervical region: Secondary | ICD-10-CM | POA: Diagnosis not present

## 2016-12-11 DIAGNOSIS — M9905 Segmental and somatic dysfunction of pelvic region: Secondary | ICD-10-CM | POA: Diagnosis not present

## 2016-12-11 DIAGNOSIS — M25551 Pain in right hip: Secondary | ICD-10-CM | POA: Diagnosis not present

## 2016-12-11 DIAGNOSIS — M4003 Postural kyphosis, cervicothoracic region: Secondary | ICD-10-CM | POA: Diagnosis not present

## 2016-12-15 DIAGNOSIS — M25551 Pain in right hip: Secondary | ICD-10-CM | POA: Diagnosis not present

## 2016-12-15 DIAGNOSIS — M4003 Postural kyphosis, cervicothoracic region: Secondary | ICD-10-CM | POA: Diagnosis not present

## 2016-12-15 DIAGNOSIS — M9901 Segmental and somatic dysfunction of cervical region: Secondary | ICD-10-CM | POA: Diagnosis not present

## 2016-12-15 DIAGNOSIS — M9905 Segmental and somatic dysfunction of pelvic region: Secondary | ICD-10-CM | POA: Diagnosis not present

## 2016-12-20 DIAGNOSIS — M9901 Segmental and somatic dysfunction of cervical region: Secondary | ICD-10-CM | POA: Diagnosis not present

## 2016-12-20 DIAGNOSIS — M25551 Pain in right hip: Secondary | ICD-10-CM | POA: Diagnosis not present

## 2016-12-20 DIAGNOSIS — M4003 Postural kyphosis, cervicothoracic region: Secondary | ICD-10-CM | POA: Diagnosis not present

## 2016-12-20 DIAGNOSIS — M9905 Segmental and somatic dysfunction of pelvic region: Secondary | ICD-10-CM | POA: Diagnosis not present

## 2016-12-28 DIAGNOSIS — M25551 Pain in right hip: Secondary | ICD-10-CM | POA: Diagnosis not present

## 2016-12-28 DIAGNOSIS — M9905 Segmental and somatic dysfunction of pelvic region: Secondary | ICD-10-CM | POA: Diagnosis not present

## 2016-12-28 DIAGNOSIS — M4003 Postural kyphosis, cervicothoracic region: Secondary | ICD-10-CM | POA: Diagnosis not present

## 2016-12-28 DIAGNOSIS — M9901 Segmental and somatic dysfunction of cervical region: Secondary | ICD-10-CM | POA: Diagnosis not present

## 2017-01-01 DIAGNOSIS — L03115 Cellulitis of right lower limb: Secondary | ICD-10-CM | POA: Diagnosis not present

## 2017-01-03 ENCOUNTER — Other Ambulatory Visit (HOSPITAL_COMMUNITY)
Admission: RE | Admit: 2017-01-03 | Discharge: 2017-01-03 | Disposition: A | Discharge status: Home / Self Care | Payer: BLUE CROSS/BLUE SHIELD | Source: Ambulatory Visit | Attending: Family Medicine | Admitting: Family Medicine

## 2017-01-03 ENCOUNTER — Other Ambulatory Visit: Payer: Self-pay | Admitting: Physician Assistant

## 2017-01-03 DIAGNOSIS — L03115 Cellulitis of right lower limb: Secondary | ICD-10-CM | POA: Diagnosis not present

## 2017-01-03 DIAGNOSIS — Z124 Encounter for screening for malignant neoplasm of cervix: Secondary | ICD-10-CM | POA: Diagnosis not present

## 2017-01-03 DIAGNOSIS — Z131 Encounter for screening for diabetes mellitus: Secondary | ICD-10-CM | POA: Diagnosis not present

## 2017-01-03 DIAGNOSIS — Z113 Encounter for screening for infections with a predominantly sexual mode of transmission: Secondary | ICD-10-CM | POA: Diagnosis not present

## 2017-01-03 DIAGNOSIS — Z0000 Encounter for general adult medical examination without abnormal findings: Secondary | ICD-10-CM | POA: Diagnosis not present

## 2017-01-03 DIAGNOSIS — Z23 Encounter for immunization: Secondary | ICD-10-CM | POA: Diagnosis not present

## 2017-01-03 DIAGNOSIS — Z13220 Encounter for screening for lipoid disorders: Secondary | ICD-10-CM | POA: Diagnosis not present

## 2017-01-04 DIAGNOSIS — L02415 Cutaneous abscess of right lower limb: Secondary | ICD-10-CM | POA: Diagnosis not present

## 2017-01-05 LAB — CYTOLOGY - PAP: Diagnosis: NEGATIVE

## 2017-01-09 DIAGNOSIS — L02415 Cutaneous abscess of right lower limb: Secondary | ICD-10-CM | POA: Diagnosis not present

## 2017-01-09 DIAGNOSIS — R899 Unspecified abnormal finding in specimens from other organs, systems and tissues: Secondary | ICD-10-CM | POA: Diagnosis not present

## 2017-01-22 DIAGNOSIS — M9901 Segmental and somatic dysfunction of cervical region: Secondary | ICD-10-CM | POA: Diagnosis not present

## 2017-01-22 DIAGNOSIS — M25551 Pain in right hip: Secondary | ICD-10-CM | POA: Diagnosis not present

## 2017-01-22 DIAGNOSIS — M4003 Postural kyphosis, cervicothoracic region: Secondary | ICD-10-CM | POA: Diagnosis not present

## 2017-01-22 DIAGNOSIS — M9905 Segmental and somatic dysfunction of pelvic region: Secondary | ICD-10-CM | POA: Diagnosis not present

## 2017-02-06 DIAGNOSIS — M25551 Pain in right hip: Secondary | ICD-10-CM | POA: Diagnosis not present

## 2017-02-06 DIAGNOSIS — M9901 Segmental and somatic dysfunction of cervical region: Secondary | ICD-10-CM | POA: Diagnosis not present

## 2017-02-06 DIAGNOSIS — M4003 Postural kyphosis, cervicothoracic region: Secondary | ICD-10-CM | POA: Diagnosis not present

## 2017-02-06 DIAGNOSIS — M9905 Segmental and somatic dysfunction of pelvic region: Secondary | ICD-10-CM | POA: Diagnosis not present

## 2017-03-20 DIAGNOSIS — F3176 Bipolar disorder, in full remission, most recent episode depressed: Secondary | ICD-10-CM | POA: Diagnosis not present

## 2017-03-20 DIAGNOSIS — F3174 Bipolar disorder, in full remission, most recent episode manic: Secondary | ICD-10-CM | POA: Diagnosis not present

## 2017-07-02 DIAGNOSIS — N764 Abscess of vulva: Secondary | ICD-10-CM | POA: Diagnosis not present

## 2017-08-01 DIAGNOSIS — F900 Attention-deficit hyperactivity disorder, predominantly inattentive type: Secondary | ICD-10-CM | POA: Diagnosis not present

## 2017-08-01 DIAGNOSIS — F331 Major depressive disorder, recurrent, moderate: Secondary | ICD-10-CM | POA: Diagnosis not present

## 2017-08-01 DIAGNOSIS — F3181 Bipolar II disorder: Secondary | ICD-10-CM | POA: Diagnosis not present

## 2017-10-03 DIAGNOSIS — F3181 Bipolar II disorder: Secondary | ICD-10-CM | POA: Diagnosis not present

## 2017-10-03 DIAGNOSIS — F3131 Bipolar disorder, current episode depressed, mild: Secondary | ICD-10-CM | POA: Diagnosis not present

## 2017-11-14 DIAGNOSIS — Z130 Encounter for screening for diseases of the blood and blood-forming organs and certain disorders involving the immune mechanism: Secondary | ICD-10-CM | POA: Diagnosis not present

## 2017-11-14 DIAGNOSIS — F3181 Bipolar II disorder: Secondary | ICD-10-CM | POA: Diagnosis not present

## 2017-11-14 DIAGNOSIS — R7309 Other abnormal glucose: Secondary | ICD-10-CM | POA: Diagnosis not present

## 2017-11-14 DIAGNOSIS — E785 Hyperlipidemia, unspecified: Secondary | ICD-10-CM | POA: Diagnosis not present

## 2017-11-14 DIAGNOSIS — Z136 Encounter for screening for cardiovascular disorders: Secondary | ICD-10-CM | POA: Diagnosis not present

## 2017-11-16 DIAGNOSIS — F3342 Major depressive disorder, recurrent, in full remission: Secondary | ICD-10-CM | POA: Diagnosis not present

## 2018-01-23 ENCOUNTER — Other Ambulatory Visit: Payer: Self-pay

## 2018-01-23 ENCOUNTER — Emergency Department (HOSPITAL_BASED_OUTPATIENT_CLINIC_OR_DEPARTMENT_OTHER): Payer: BLUE CROSS/BLUE SHIELD

## 2018-01-23 ENCOUNTER — Emergency Department (HOSPITAL_BASED_OUTPATIENT_CLINIC_OR_DEPARTMENT_OTHER)
Admission: EM | Admit: 2018-01-23 | Discharge: 2018-01-24 | Disposition: A | Discharge status: Home / Self Care | Payer: BLUE CROSS/BLUE SHIELD | Attending: Emergency Medicine | Admitting: Emergency Medicine

## 2018-01-23 ENCOUNTER — Encounter (HOSPITAL_BASED_OUTPATIENT_CLINIC_OR_DEPARTMENT_OTHER): Payer: Self-pay

## 2018-01-23 DIAGNOSIS — R310 Gross hematuria: Secondary | ICD-10-CM | POA: Diagnosis not present

## 2018-01-23 DIAGNOSIS — R109 Unspecified abdominal pain: Secondary | ICD-10-CM | POA: Insufficient documentation

## 2018-01-23 DIAGNOSIS — Z79899 Other long term (current) drug therapy: Secondary | ICD-10-CM | POA: Diagnosis not present

## 2018-01-23 DIAGNOSIS — F17210 Nicotine dependence, cigarettes, uncomplicated: Secondary | ICD-10-CM | POA: Insufficient documentation

## 2018-01-23 DIAGNOSIS — N390 Urinary tract infection, site not specified: Secondary | ICD-10-CM | POA: Diagnosis not present

## 2018-01-23 DIAGNOSIS — R319 Hematuria, unspecified: Secondary | ICD-10-CM | POA: Diagnosis not present

## 2018-01-23 LAB — URINALYSIS, ROUTINE W REFLEX MICROSCOPIC
BILIRUBIN URINE: NEGATIVE
Glucose, UA: NEGATIVE mg/dL
Ketones, ur: NEGATIVE mg/dL
Leukocytes, UA: NEGATIVE
Nitrite: NEGATIVE
PH: 6 (ref 5.0–8.0)
Protein, ur: NEGATIVE mg/dL
Specific Gravity, Urine: >1.03 — ABNORMAL HIGH (ref 1.005–1.030)

## 2018-01-23 LAB — URINALYSIS, MICROSCOPIC (REFLEX)

## 2018-01-23 LAB — PREGNANCY, URINE: Preg Test, Ur: NEGATIVE

## 2018-01-23 NOTE — ED Notes (Signed)
CCUA obtained-[facility] now [facility] and hands me [facility]-paper reads pt with +blood in urine and sent to r/o kidney stone

## 2018-01-23 NOTE — ED Provider Notes (Signed)
[facility] Provider Note: Hayley White Hayley Barrantes, MD, FACEP  CSN: [id number] MRN: [id number] ARRIVAL: 01/23/18 at 2130 ROOM: MH10/MH10   CHIEF COMPLAINT  Flank Pain   HISTORY OF PRESENT ILLNESS  01/23/18 11:20 PM Hayley BladesKristen White is a 47 y.o. female with a one-week history of urinary symptoms.  Specifically she has had burning with urination and urinary urgency and frequency.  The symptoms have been waxing and waning and have not been severe.  Yesterday evening she had gross hematuria with passage of clots.  She continued to have hematuria earlier today which she states was confirmed at an [facility]in [facility].  She has developed pain in her right flank which she describes as a dull ache which is mild presently but has been more severe at times.  She denies fever, chills, nausea, vomiting or diarrhea.  She did have one episode of loose stool earlier today.   Past Medical History:  Diagnosis Date  . ADD (attention deficit disorder)   . Bipolar 1 disorder (HCC)   . Hiatal hernia   . Obesity   . Seasonal allergies     Past Surgical History:  Procedure Laterality Date  . CHOLECYSTECTOMY      No family history on file.  Social History   Tobacco Use  . Smoking status: Current Every Day Smoker    Types: Cigarettes, E-cigarettes  . Smokeless tobacco: Never Used  Substance Use Topics  . Alcohol use: Yes    Comment: occ  . Drug use: Yes    Types: Marijuana    Prior to Admission medications   Medication Sig Start Date End Date Taking? Authorizing Provider  buPROPion (WELLBUTRIN SR) 200 MG 12 hr tablet Take 200 mg by mouth 2 (two) times daily. Take two times a day  Per patient    [provider]  cephALEXin (KEFLEX) 500 MG capsule Take 1 capsule (500 mg total) by mouth 4 (four) times daily. 10/10/14   Muthersbaugh, Dahlia ClientHannah, PA-C  cloNIDine (CATAPRES) 0.2 MG tablet Take 0.2 mg by mouth at bedtime.     [provider]  CRANBERRY PO Take 8 tablets by mouth  daily. Take 8 tablets every day per patient    [provider]  diphenhydrAMINE (BENADRYL) 25 mg capsule Take 25 mg by mouth at bedtime. Take every day per patient    [provider]  fluconazole (DIFLUCAN) 150 MG tablet Take 1 tablet (150 mg total) by mouth once. 10/10/14   Muthersbaugh, Dahlia ClientHannah, PA-C  HYDROcodone-acetaminophen (NORCO/VICODIN) 5-325 MG per tablet Take 1-2 tablets by mouth every 6 (six) hours as needed for moderate pain or severe pain. 10/10/14   Muthersbaugh, Dahlia ClientHannah, PA-C  Ibuprofen 200 MG CAPS Take 1 capsule by mouth daily.    [provider]  lamoTRIgine (LAMICTAL) 150 MG tablet Take 150 mg by mouth daily.    [provider]  loratadine (CLARITIN) 10 MG tablet Take 10 mg by mouth daily.    [provider]  omeprazole (PRILOSEC) 20 MG capsule Take 20 mg by mouth daily.    [provider]  ondansetron (ZOFRAN ODT) 4 MG disintegrating tablet 4mg  ODT q4 hours prn nausea/vomit 10/10/14   Muthersbaugh, Dahlia ClientHannah, PA-C  pseudoephedrine (SUDAFED) 120 MG 12 hr tablet Take 120 mg by mouth daily.    [provider]  traZODone (DESYREL) 50 MG tablet Take 50 mg by mouth at bedtime.    [provider]    Allergies Patient has no known allergies.   REVIEW OF  SYSTEMS  Negative except as noted here or in the History of Present Illness.   PHYSICAL EXAMINATION  Initial Vital Signs Blood pressure (!) 148/79, pulse 84, temperature 98.7 F (37.1 C), temperature source Oral, resp. rate 20, height 5\' 4"  (1.626 m), weight 122 kg, SpO2 98 %.  Examination General: Well-developed, well-nourished female in no acute distress; appearance consistent with age of record HENT: normocephalic; atraumatic Eyes: pupils equal, round and reactive to light; extraocular muscles intact Neck: supple Heart: regular rate and rhythm Lungs: clear to auscultation bilaterally Abdomen: soft; nondistended; nontender; bowel sounds present GU: No CVA  tenderness Extremities: No deformity; full range of motion; pulses normal Neurologic: Awake, alert and oriented; motor function intact in all extremities and symmetric; no facial droop Skin: Warm and dry Psychiatric: Normal mood and affect   RESULTS  Summary of this visit's results, reviewed by myself:   EKG Interpretation  Date/Time:    Ventricular Rate:    PR Interval:    QRS Duration:   QT Interval:    QTC Calculation:   R Axis:     Text Interpretation:        Laboratory Studies: Results for orders placed or performed during the hospital encounter of 01/23/18 (from the past 24 hour(s))  Pregnancy, urine     Status: None   Collection Time: 01/23/18  9:41 PM  Result Value Ref Range   Preg Test, Ur NEGATIVE NEGATIVE  Urinalysis, Routine w reflex microscopic     Status: Abnormal   Collection Time: 01/23/18  9:41 PM  Result Value Ref Range   Color, Urine YELLOW YELLOW   APPearance CLEAR CLEAR   Specific Gravity, Urine >1.030 (H) 1.005 - 1.030   pH 6.0 5.0 - 8.0   Glucose, UA NEGATIVE NEGATIVE mg/dL   Hgb urine dipstick TRACE (A) NEGATIVE   Bilirubin Urine NEGATIVE NEGATIVE   Ketones, ur NEGATIVE NEGATIVE mg/dL   Protein, ur NEGATIVE NEGATIVE mg/dL   Nitrite NEGATIVE NEGATIVE   Leukocytes, UA NEGATIVE NEGATIVE  Urinalysis, Microscopic (reflex)     Status: Abnormal   Collection Time: 01/23/18  9:41 PM  Result Value Ref Range   RBC / HPF 0-5 0 - 5 RBC/hpf   WBC, UA 0-5 0 - 5 WBC/hpf   Bacteria, UA FEW (A) NONE SEEN   Squamous Epithelial / LPF 0-5 0 - 5   Imaging Studies: Ct Renal Stone Study  Result Date: 01/24/2018 CLINICAL DATA:  Right flank pain, hematuria and dysuria x6 days. Diarrhea today. EXAM: CT ABDOMEN AND PELVIS WITHOUT CONTRAST TECHNIQUE: Multidetector CT imaging of the abdomen and pelvis was performed following the standard protocol without IV contrast. COMPARISON:  10/27/2016 CT FINDINGS: Lower chest: No acute abnormality. Hepatobiliary: No focal liver  abnormality is seen. Status post cholecystectomy. No biliary dilatation. Pancreas: Unremarkable. No pancreatic ductal dilatation or surrounding inflammatory changes. Spleen: Subtle low-density lesions of the spleen are redemonstrated suspicious for hamartomas or hemangiomas. No splenomegaly. No subcapsular fluid. Adrenals/Urinary Tract: Normal bilateral adrenal glands. Nephrolithiasis no obstructive uropathy. The urinary bladder is unremarkable. The kidneys are symmetric in appearance without focal cortical thinning, scarring or mass identified. Stomach/Bowel: Stomach is within normal limits. Appendix appears normal. No evidence of bowel wall thickening, distention, or inflammatory changes. Vascular/Lymphatic: No significant vascular findings are present. No enlarged abdominal or pelvic lymph nodes. Reproductive: Uterus and bilateral adnexa are unremarkable. IUD noted in the endometrial cavity. Other: Small fat containing umbilical hernia. Musculoskeletal: Thoracolumbar spondylosis with multilevel mild moderate disc flattening. No aggressive osseous  lesions or fracture. Facet arthrosis L4-5 L5-S1. IMPRESSION: 1. No nephrolithiasis nor obstructive uropathy. 2. Low-density lesions of the spleen are redemonstrated, nonspecific but may reflect splenic hamartomas or hemangiomas. 3. Small fat containing umbilical hernia. 4. IUD noted in atrial cavity without complicating features. 5. Status post cholecystectomy. Electronically Signed   By: Tollie Eth M.D.   On: 01/24/2018 00:33    ED COURSE and MDM  Nursing notes and initial vitals signs, including pulse oximetry, reviewed.  Vitals:   01/23/18 2139 01/23/18 2140 01/24/18 0009  BP:  (!) 148/79 118/71  Pulse:  84 73  Resp:  20 18  Temp:  98.7 F (37.1 C)   TempSrc:  Oral   SpO2:  98% 100%  Weight: 122 kg    Height: 5\' 4"  (1.626 m)     Patient advised of reassuring urinalysis and CT scan.  The cause of her symptomatology is unclear. Her right flank pain  may be musculoskeletal.  There is no evidence of nephrolithiasis or obstructive uropathy.  She had a similar episode in the past and was evaluated by urologist with negative work-up then.  PROCEDURES    ED DIAGNOSES     ICD-10-CM   1. Right flank pain R10.9   2. Gross hematuria R31.0        Antigone Crowell, Jonny Ruiz, MD 01/24/18 [phone]

## 2018-01-23 NOTE — ED Notes (Signed)
Patient transported to CT 

## 2018-01-23 NOTE — ED Triage Notes (Signed)
C/o right flank pain x today-"uti sx" x 1 week-NAD-steady gait

## 2018-01-24 DIAGNOSIS — R319 Hematuria, unspecified: Secondary | ICD-10-CM | POA: Diagnosis not present

## 2018-01-24 NOTE — ED Notes (Signed)
Pt verbalizes understanding of d/c instructions and denies any further needs at this time. 

## 2018-01-24 NOTE — ED Notes (Signed)
Pt returned from CT °

## 2018-03-11 DIAGNOSIS — L03311 Cellulitis of abdominal wall: Secondary | ICD-10-CM | POA: Diagnosis not present

## 2018-03-14 DIAGNOSIS — L02211 Cutaneous abscess of abdominal wall: Secondary | ICD-10-CM | POA: Diagnosis not present

## 2018-03-18 DIAGNOSIS — L02211 Cutaneous abscess of abdominal wall: Secondary | ICD-10-CM | POA: Diagnosis not present

## 2018-05-10 DIAGNOSIS — F3181 Bipolar II disorder: Secondary | ICD-10-CM | POA: Diagnosis not present

## 2018-06-17 DIAGNOSIS — Z0000 Encounter for general adult medical examination without abnormal findings: Secondary | ICD-10-CM | POA: Diagnosis not present

## 2018-06-17 DIAGNOSIS — Z720 Tobacco use: Secondary | ICD-10-CM | POA: Diagnosis not present

## 2018-06-17 DIAGNOSIS — Z13220 Encounter for screening for lipoid disorders: Secondary | ICD-10-CM | POA: Diagnosis not present

## 2018-06-23 ENCOUNTER — Other Ambulatory Visit: Payer: Self-pay

## 2018-06-23 ENCOUNTER — Ambulatory Visit (HOSPITAL_COMMUNITY)
Admission: EM | Admit: 2018-06-23 | Discharge: 2018-06-23 | Disposition: A | Discharge status: Home / Self Care | Payer: BLUE CROSS/BLUE SHIELD | Attending: Internal Medicine | Admitting: Internal Medicine

## 2018-06-23 ENCOUNTER — Telehealth (HOSPITAL_COMMUNITY): Payer: Self-pay | Admitting: Emergency Medicine

## 2018-06-23 ENCOUNTER — Encounter (HOSPITAL_COMMUNITY): Payer: Self-pay

## 2018-06-23 DIAGNOSIS — J209 Acute bronchitis, unspecified: Secondary | ICD-10-CM | POA: Diagnosis not present

## 2018-06-23 MED ORDER — PREDNISONE 10 MG (21) PO TBPK: ORAL_TABLET | Freq: Every day | ORAL | 0 refills | Status: DC

## 2018-06-23 MED ORDER — PREDNISONE 10 MG (21) PO TBPK: ORAL_TABLET | Freq: Every day | ORAL | 0 refills | Status: AC

## 2018-06-23 NOTE — Telephone Encounter (Signed)
Pharmacy change

## 2018-06-23 NOTE — ED Triage Notes (Signed)
Pt cc cough congestion and chest congestion. X 4 days

## 2018-07-14 DIAGNOSIS — R05 Cough: Secondary | ICD-10-CM | POA: Diagnosis not present

## 2018-10-14 ENCOUNTER — Other Ambulatory Visit: Payer: Self-pay | Admitting: Physician Assistant

## 2018-10-14 DIAGNOSIS — N6310 Unspecified lump in the right breast, unspecified quadrant: Secondary | ICD-10-CM

## 2018-10-15 DIAGNOSIS — L03116 Cellulitis of left lower limb: Secondary | ICD-10-CM | POA: Diagnosis not present

## 2018-10-17 DIAGNOSIS — K148 Other diseases of tongue: Secondary | ICD-10-CM | POA: Diagnosis not present

## 2018-10-17 DIAGNOSIS — L03116 Cellulitis of left lower limb: Secondary | ICD-10-CM | POA: Diagnosis not present

## 2018-10-25 ENCOUNTER — Other Ambulatory Visit: Payer: BLUE CROSS/BLUE SHIELD

## 2018-10-25 ENCOUNTER — Other Ambulatory Visit: Payer: Self-pay | Admitting: Physician Assistant

## 2018-10-25 ENCOUNTER — Other Ambulatory Visit: Payer: Self-pay

## 2018-10-25 ENCOUNTER — Ambulatory Visit
Admission: RE | Admit: 2018-10-25 | Discharge: 2018-10-25 | Disposition: A | Discharge status: Home / Self Care | Payer: BC Managed Care – PPO | Source: Ambulatory Visit | Attending: Physician Assistant | Admitting: Physician Assistant

## 2018-10-25 ENCOUNTER — Ambulatory Visit
Admission: RE | Admit: 2018-10-25 | Discharge: 2018-10-25 | Disposition: A | Discharge status: Home / Self Care | Payer: BLUE CROSS/BLUE SHIELD | Source: Ambulatory Visit | Attending: Physician Assistant | Admitting: Physician Assistant

## 2018-10-25 DIAGNOSIS — N6313 Unspecified lump in the right breast, lower outer quadrant: Secondary | ICD-10-CM | POA: Diagnosis not present

## 2018-10-25 DIAGNOSIS — N6310 Unspecified lump in the right breast, unspecified quadrant: Secondary | ICD-10-CM

## 2018-10-25 DIAGNOSIS — N6314 Unspecified lump in the right breast, lower inner quadrant: Secondary | ICD-10-CM | POA: Diagnosis not present

## 2018-10-25 DIAGNOSIS — R928 Other abnormal and inconclusive findings on diagnostic imaging of breast: Secondary | ICD-10-CM | POA: Diagnosis not present

## 2018-10-30 DIAGNOSIS — F900 Attention-deficit hyperactivity disorder, predominantly inattentive type: Secondary | ICD-10-CM | POA: Diagnosis not present

## 2018-10-30 DIAGNOSIS — F3181 Bipolar II disorder: Secondary | ICD-10-CM | POA: Diagnosis not present

## 2018-12-28 DIAGNOSIS — L089 Local infection of the skin and subcutaneous tissue, unspecified: Secondary | ICD-10-CM | POA: Diagnosis not present

## 2019-04-28 ENCOUNTER — Other Ambulatory Visit: Payer: BC Managed Care – PPO

## 2019-05-05 ENCOUNTER — Ambulatory Visit
Admission: RE | Admit: 2019-05-05 | Discharge: 2019-05-05 | Disposition: A | Discharge status: Home / Self Care | Payer: BC Managed Care – PPO | Source: Ambulatory Visit | Attending: Physician Assistant | Admitting: Physician Assistant

## 2019-05-05 ENCOUNTER — Other Ambulatory Visit: Payer: Self-pay

## 2019-05-05 DIAGNOSIS — L0390 Cellulitis, unspecified: Secondary | ICD-10-CM | POA: Diagnosis not present

## 2019-05-05 DIAGNOSIS — N6313 Unspecified lump in the right breast, lower outer quadrant: Secondary | ICD-10-CM | POA: Diagnosis not present

## 2019-05-05 DIAGNOSIS — N6310 Unspecified lump in the right breast, unspecified quadrant: Secondary | ICD-10-CM

## 2019-05-07 DIAGNOSIS — F331 Major depressive disorder, recurrent, moderate: Secondary | ICD-10-CM | POA: Diagnosis not present

## 2019-06-18 DIAGNOSIS — F900 Attention-deficit hyperactivity disorder, predominantly inattentive type: Secondary | ICD-10-CM | POA: Diagnosis not present

## 2019-06-18 DIAGNOSIS — F3342 Major depressive disorder, recurrent, in full remission: Secondary | ICD-10-CM | POA: Diagnosis not present

## 2019-07-08 IMAGING — US ULTRASOUND RIGHT BREAST LIMITED
1 series · 6 of 6 positions shown · non-contrast
Comparison: Previous exam(s).

CLINICAL DATA: 47-year-old female presenting for delayed follow-up
of a probably benign right breast mass.

EXAM:
DIGITAL DIAGNOSTIC BILATERAL MAMMOGRAM WITH CAD AND TOMO
ULTRASOUND RIGHT BREAST

[Series 1: ultrasound right breast limited · 0.07mm/px · 6 of 6 slices shown]
[im 1/6]
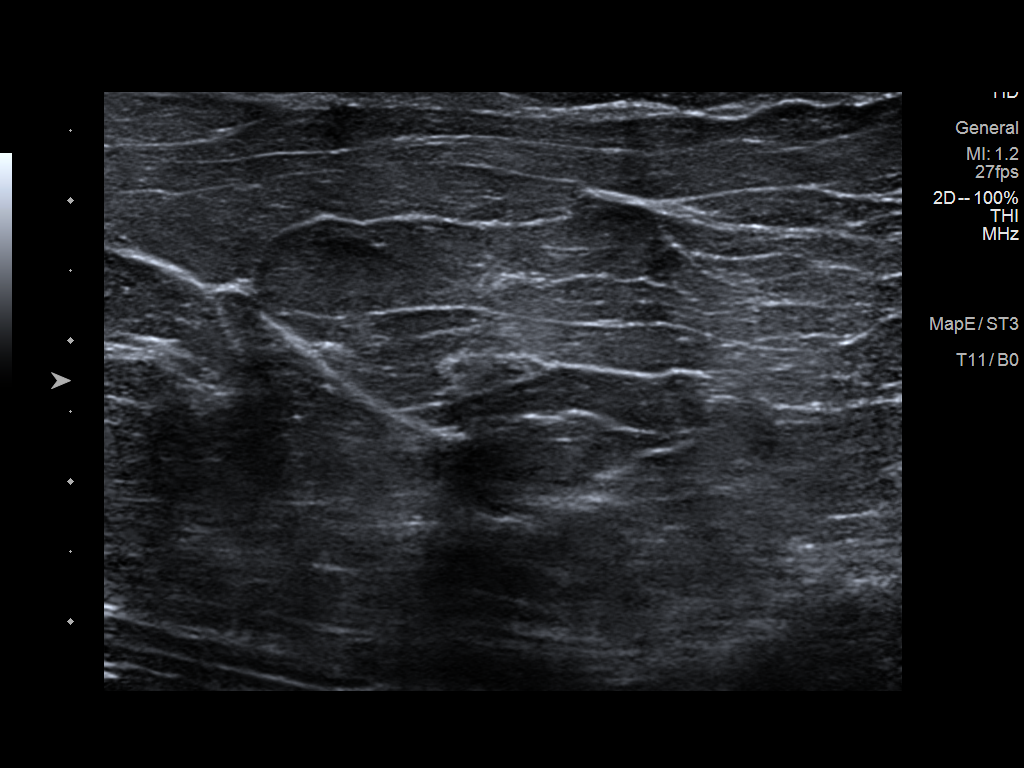
[im 2/6]
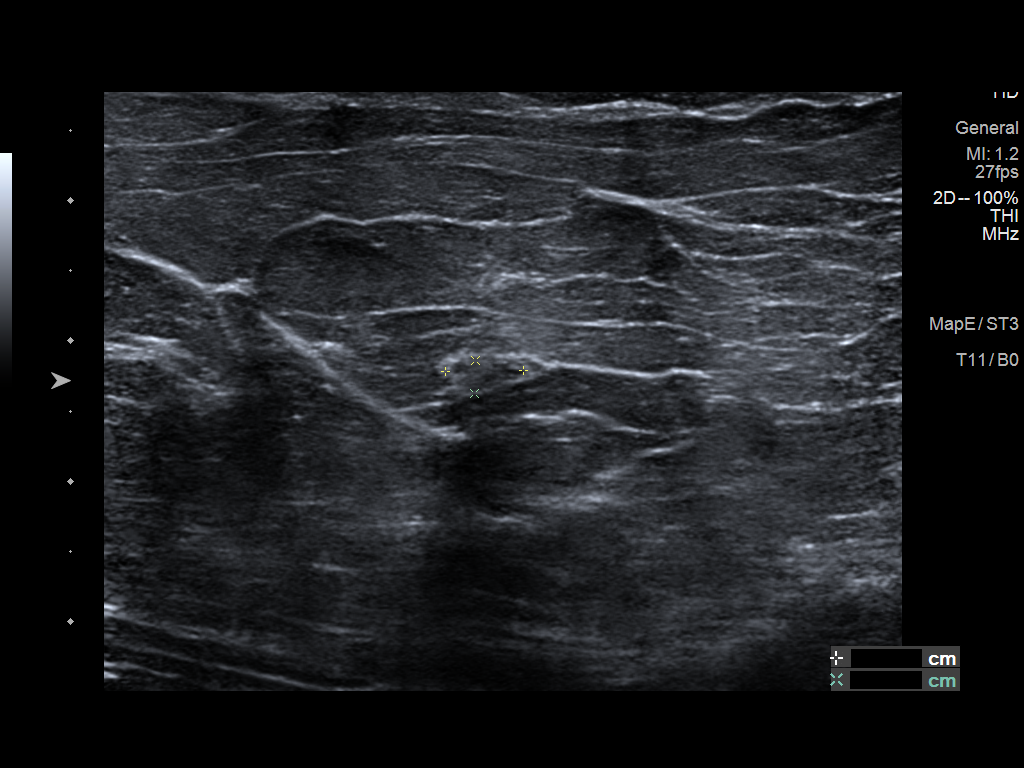
[im 3/6]
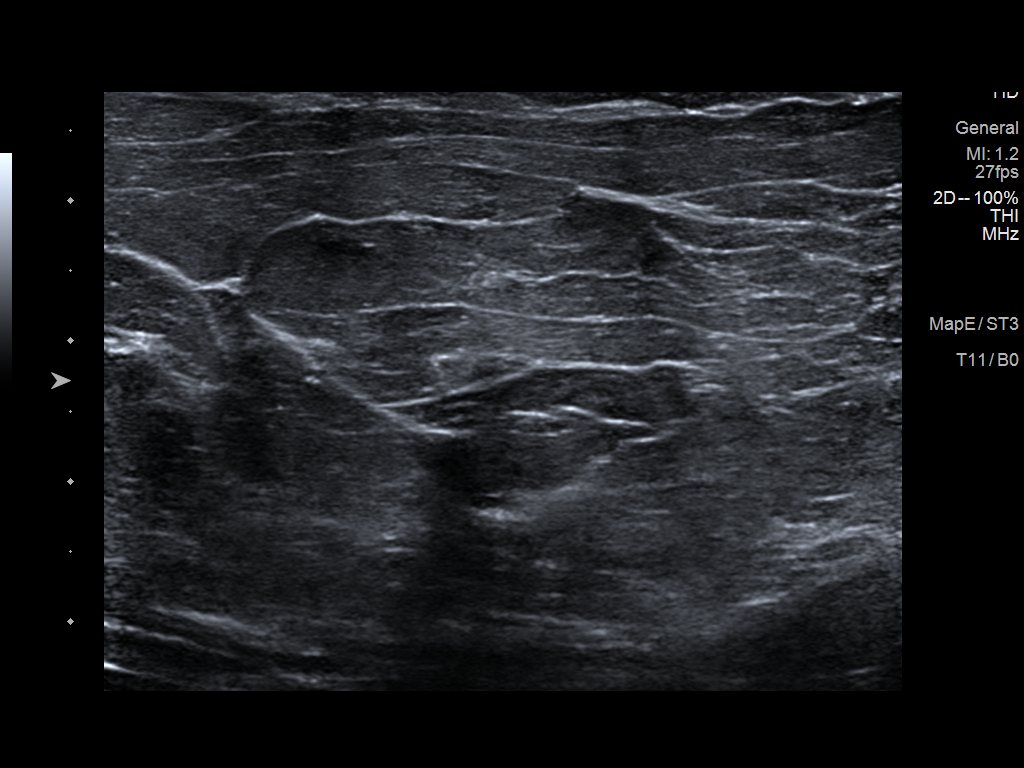
[im 4/6]
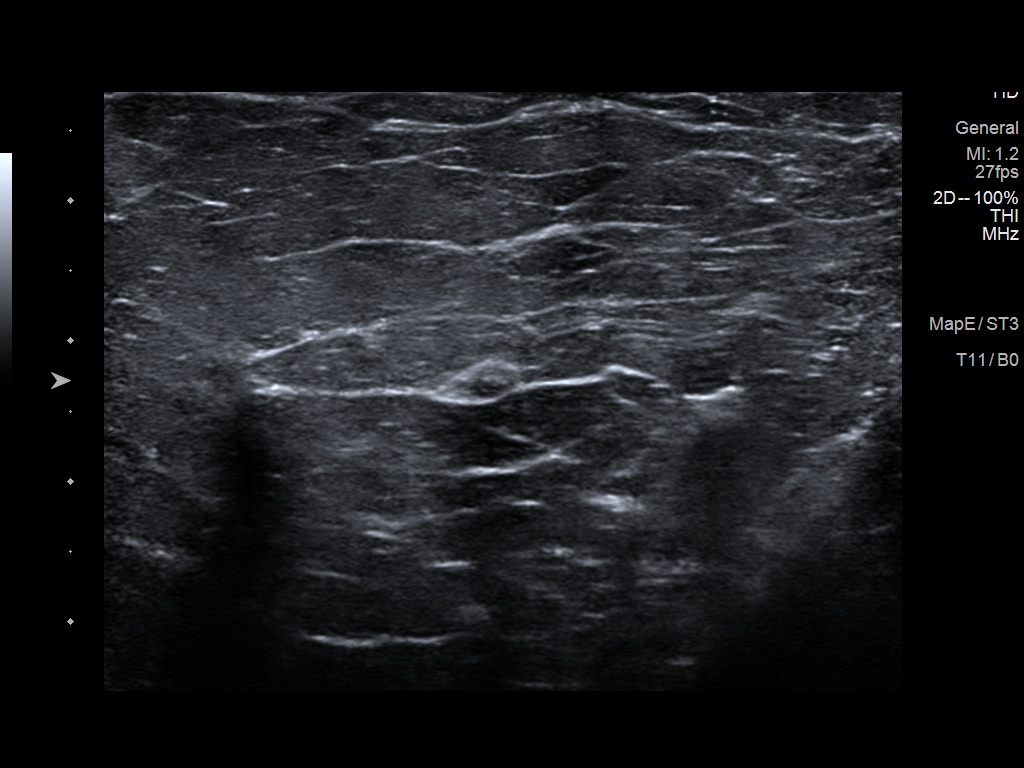
[im 5/6]
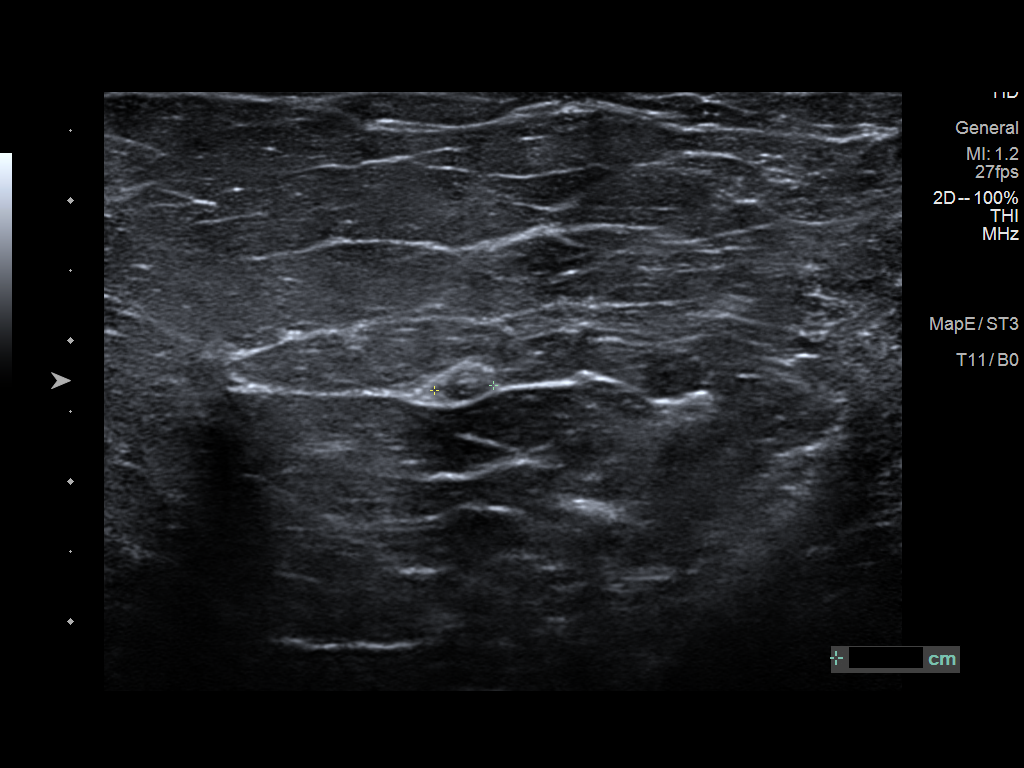
[im 6/6]
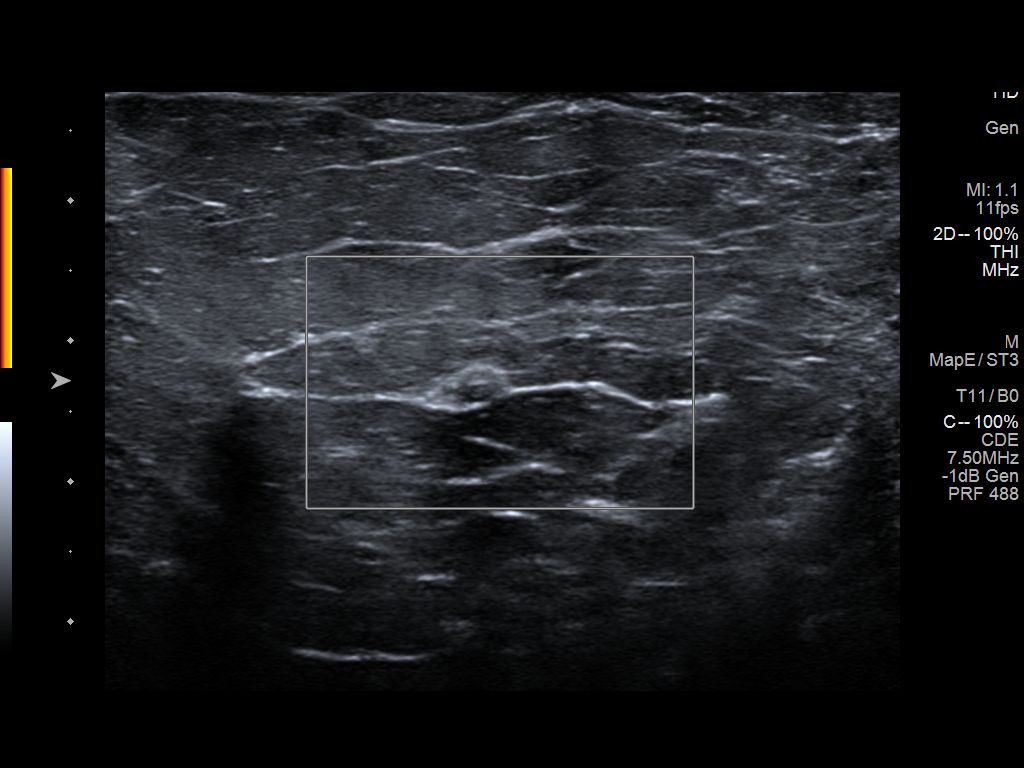

[6 of 6 positions shown; findings below may reference images not displayed]

ACR Breast Density Category b: There are scattered areas of
fibroglandular density.
FINDINGS: The small mass in the inferior central right breast is
mammographically stable. No suspicious calcifications, masses or
areas of distortion are seen in the bilateral breasts.

Mammographic images were processed with CAD.

The mass in the right breast at 6 o'clock, 2 cm from the nipple is
stable measuring, previously measuring 6 x 2 x 4 mm, previously 7 x
2 x 4 mm.
IMPRESSION: 1.  The probably benign right breast mass at 6 o'clock is stable.

2.  No mammographic evidence of malignancy in the bilateral breasts.

RECOMMENDATION:
Six-month follow-up right breast ultrasound is recommended to
complete the 2 year follow-up of the probably benign right breast
mass. Presuming the mass is stable at that time, the patient may
return to annual screening mammography in Monday October, 2019.

I have discussed the findings and recommendations with the patient.
Results were also provided in writing at the conclusion of the
visit. If applicable, a reminder letter will be sent to the patient
regarding the next appointment.

BI-RADS CATEGORY  3: Probably benign.

## 2019-07-08 IMAGING — MG DIGITAL DIAGNOSTIC BILATERAL MAMMOGRAM WITH TOMO AND CAD
6 of 10 series · 6 of 30 positions shown · non-contrast
Comparison: Previous exam(s).

CLINICAL DATA: 47-year-old female presenting for delayed follow-up
of a probably benign right breast mass.

EXAM:
DIGITAL DIAGNOSTIC BILATERAL MAMMOGRAM WITH CAD AND TOMO
ULTRASOUND RIGHT BREAST

[R MLO synth-2D]
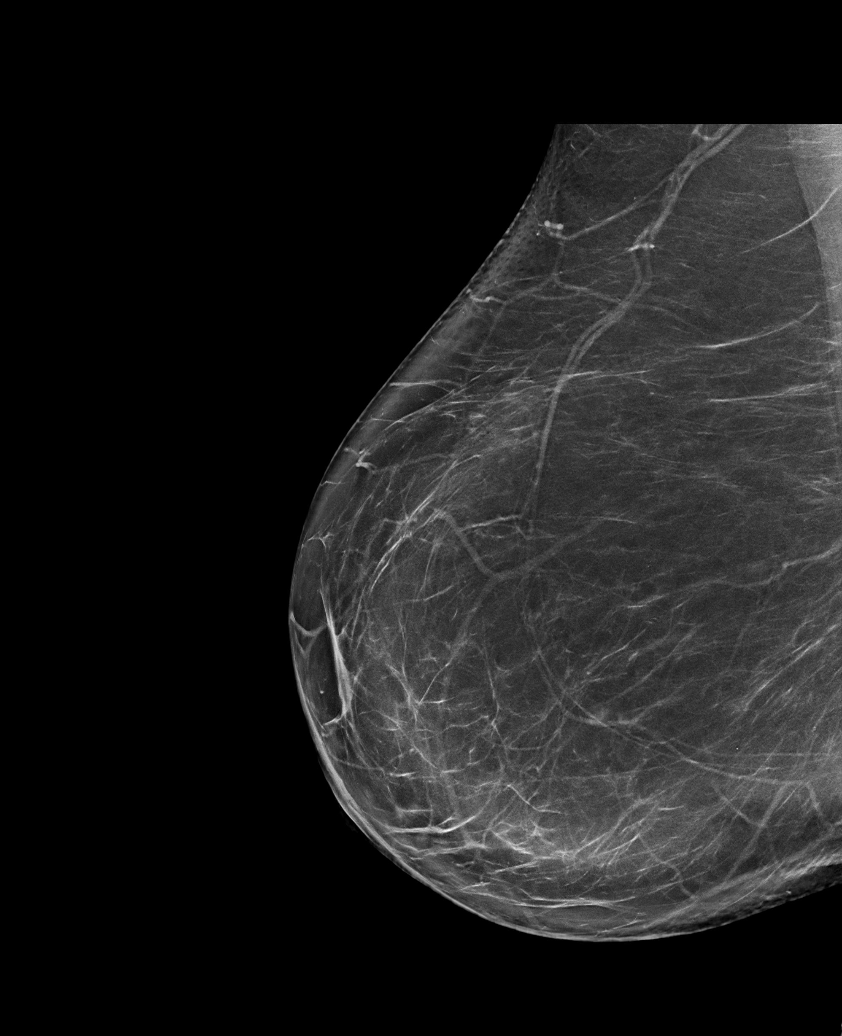

[R CC synth-2D (1 of 2)]
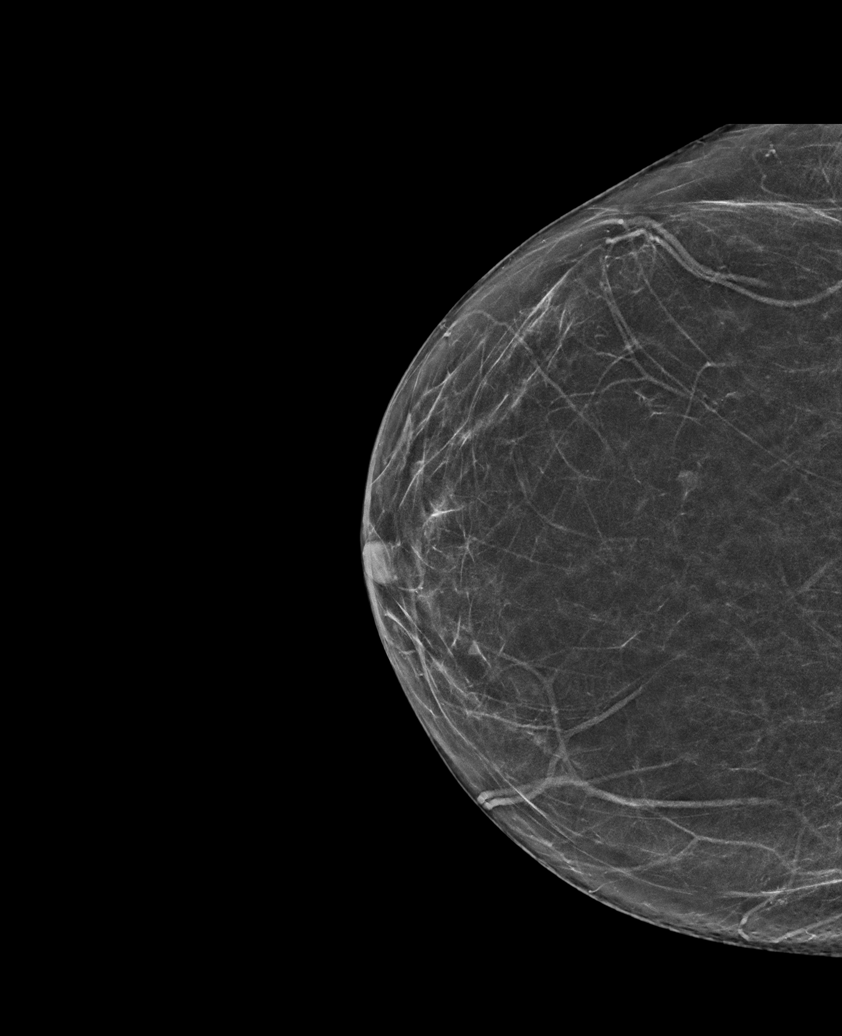

[L MLO synth-2D]
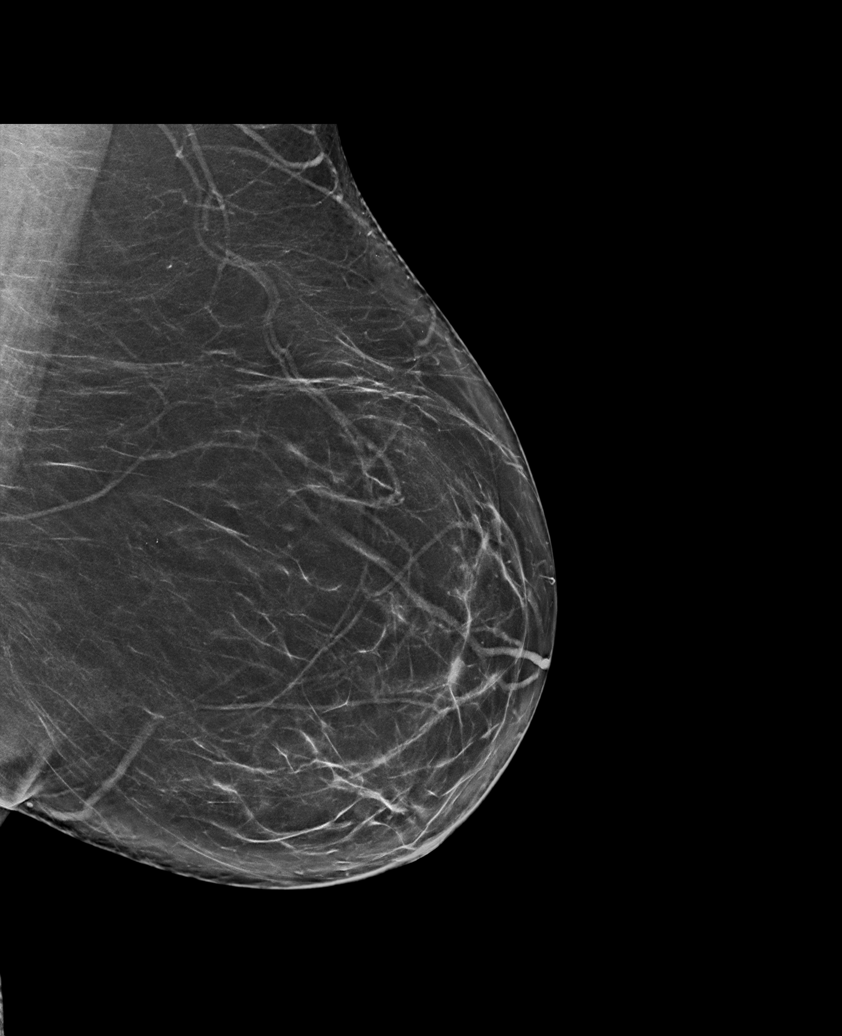

[L CC synth-2D]
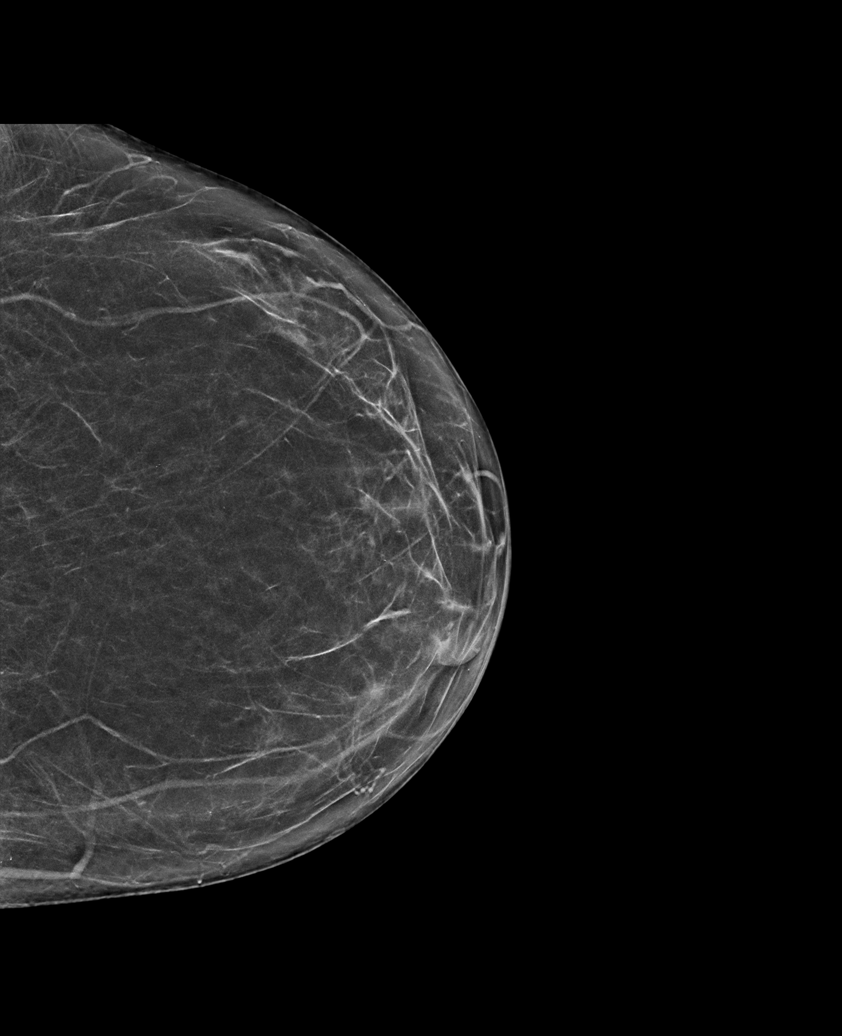

[R CC synth-2D (2 of 2)]
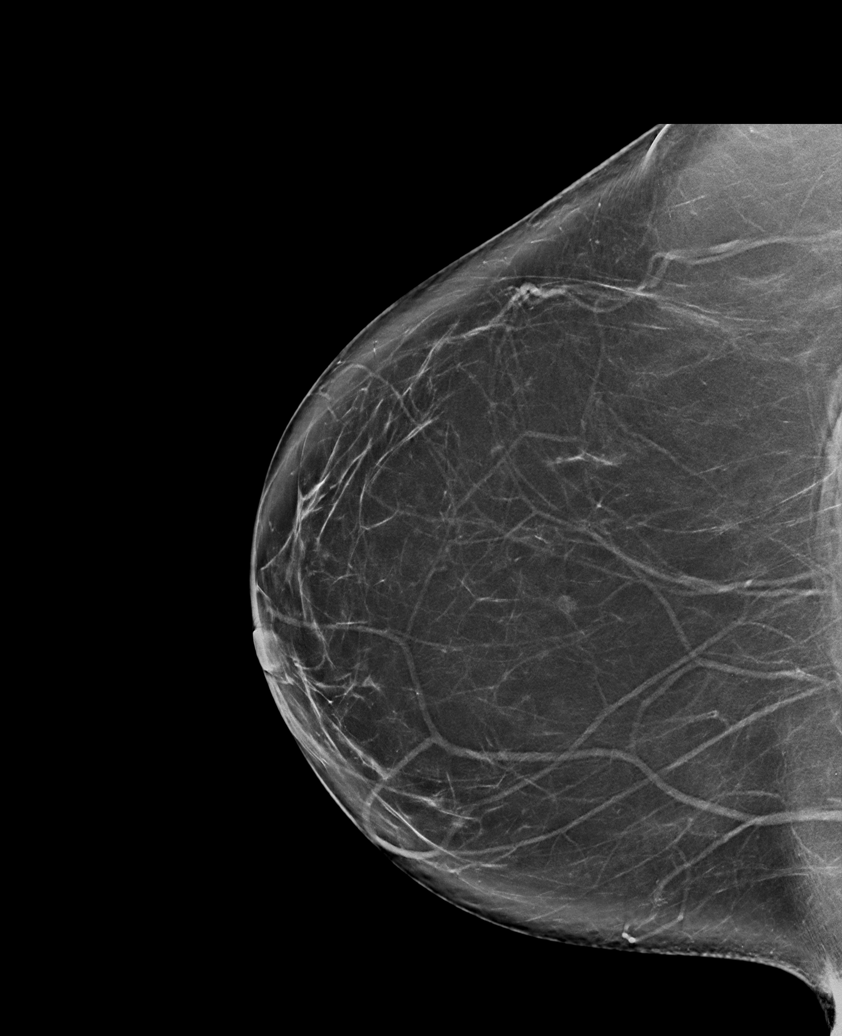

[L CC tomo · tomo slice 35/68.0]
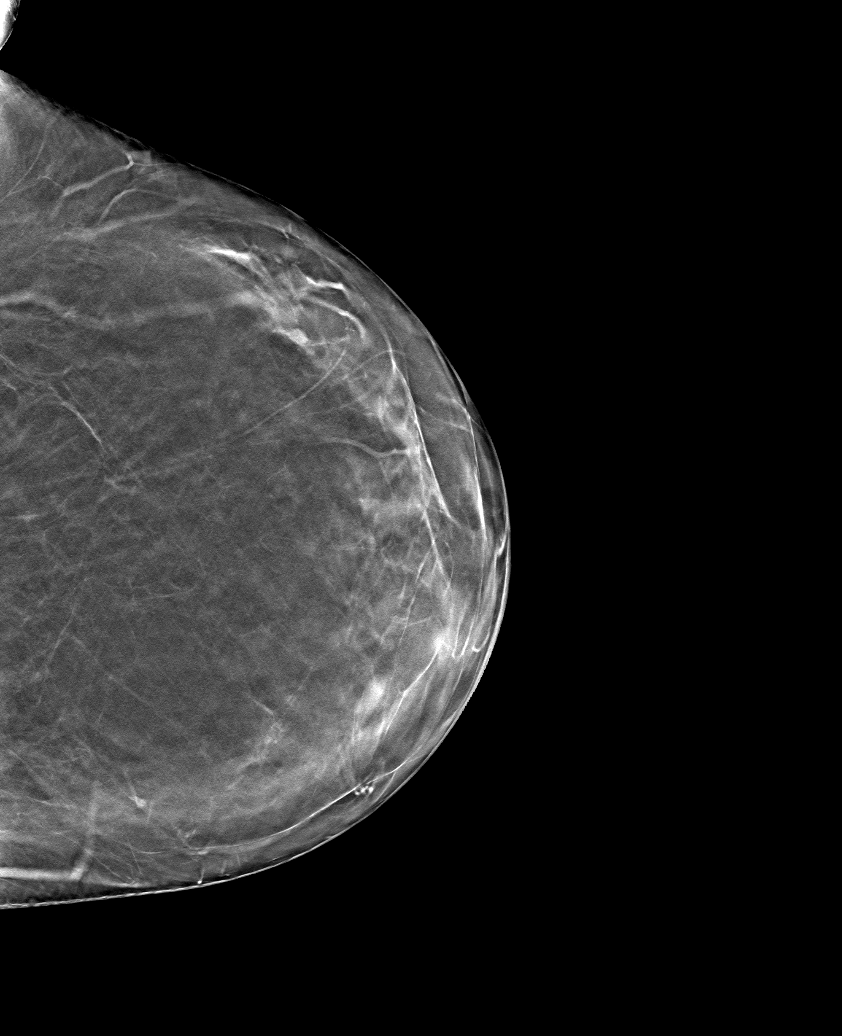

[6 of 30 positions shown; findings below may reference images not displayed]

ACR Breast Density Category b: There are scattered areas of
fibroglandular density.
FINDINGS: The small mass in the inferior central right breast is
mammographically stable. No suspicious calcifications, masses or
areas of distortion are seen in the bilateral breasts.

Mammographic images were processed with CAD.

The mass in the right breast at 6 o'clock, 2 cm from the nipple is
stable measuring, previously measuring 6 x 2 x 4 mm, previously 7 x
2 x 4 mm.
IMPRESSION: 1.  The probably benign right breast mass at 6 o'clock is stable.

2.  No mammographic evidence of malignancy in the bilateral breasts.

RECOMMENDATION:
Six-month follow-up right breast ultrasound is recommended to
complete the 2 year follow-up of the probably benign right breast
mass. Presuming the mass is stable at that time, the patient may
return to annual screening mammography in Monday October, 2019.

I have discussed the findings and recommendations with the patient.
Results were also provided in writing at the conclusion of the
visit. If applicable, a reminder letter will be sent to the patient
regarding the next appointment.

BI-RADS CATEGORY  3: Probably benign.

## 2019-07-14 ENCOUNTER — Other Ambulatory Visit (HOSPITAL_COMMUNITY)
Admission: RE | Admit: 2019-07-14 | Discharge: 2019-07-14 | Disposition: A | Discharge status: Home / Self Care | Payer: BC Managed Care – PPO | Source: Ambulatory Visit | Attending: Physician Assistant | Admitting: Physician Assistant

## 2019-07-14 DIAGNOSIS — Z124 Encounter for screening for malignant neoplasm of cervix: Secondary | ICD-10-CM | POA: Insufficient documentation

## 2019-07-14 DIAGNOSIS — Z6841 Body Mass Index (BMI) 40.0 and over, adult: Secondary | ICD-10-CM | POA: Diagnosis not present

## 2019-07-14 DIAGNOSIS — Z13220 Encounter for screening for lipoid disorders: Secondary | ICD-10-CM | POA: Diagnosis not present

## 2019-07-14 DIAGNOSIS — Z0000 Encounter for general adult medical examination without abnormal findings: Secondary | ICD-10-CM | POA: Diagnosis not present

## 2019-07-16 LAB — CYTOLOGY - PAP
Adequacy: ABSENT
Diagnosis: NEGATIVE

## 2019-12-16 DIAGNOSIS — F3342 Major depressive disorder, recurrent, in full remission: Secondary | ICD-10-CM | POA: Diagnosis not present

## 2020-03-08 ENCOUNTER — Ambulatory Visit
Admission: RE | Admit: 2020-03-08 | Discharge: 2020-03-08 | Disposition: A | Discharge status: Home / Self Care | Payer: BC Managed Care – PPO | Source: Ambulatory Visit | Attending: Physician Assistant | Admitting: Physician Assistant

## 2020-03-08 ENCOUNTER — Other Ambulatory Visit: Payer: Self-pay | Admitting: Physician Assistant

## 2020-03-08 DIAGNOSIS — R52 Pain, unspecified: Secondary | ICD-10-CM

## 2020-03-08 DIAGNOSIS — M545 Low back pain, unspecified: Secondary | ICD-10-CM | POA: Diagnosis not present

## 2020-03-08 DIAGNOSIS — M25551 Pain in right hip: Secondary | ICD-10-CM | POA: Diagnosis not present

## 2020-03-08 DIAGNOSIS — M1990 Unspecified osteoarthritis, unspecified site: Secondary | ICD-10-CM | POA: Diagnosis not present

## 2020-03-08 DIAGNOSIS — M25552 Pain in left hip: Secondary | ICD-10-CM | POA: Diagnosis not present

## 2020-05-06 DIAGNOSIS — Z9181 History of falling: Secondary | ICD-10-CM | POA: Diagnosis not present

## 2020-05-06 DIAGNOSIS — M25561 Pain in right knee: Secondary | ICD-10-CM | POA: Diagnosis not present

## 2020-05-13 DIAGNOSIS — Z20828 Contact with and (suspected) exposure to other viral communicable diseases: Secondary | ICD-10-CM | POA: Diagnosis not present

## 2020-05-26 DIAGNOSIS — M25561 Pain in right knee: Secondary | ICD-10-CM | POA: Diagnosis not present

## 2020-06-10 DIAGNOSIS — F3181 Bipolar II disorder: Secondary | ICD-10-CM | POA: Diagnosis not present

## 2020-06-10 DIAGNOSIS — F410 Panic disorder [episodic paroxysmal anxiety] without agoraphobia: Secondary | ICD-10-CM | POA: Diagnosis not present

## 2020-06-16 DIAGNOSIS — M25561 Pain in right knee: Secondary | ICD-10-CM | POA: Diagnosis not present

## 2020-06-16 DIAGNOSIS — M545 Low back pain, unspecified: Secondary | ICD-10-CM | POA: Diagnosis not present

## 2020-06-16 DIAGNOSIS — M25562 Pain in left knee: Secondary | ICD-10-CM | POA: Diagnosis not present

## 2020-06-24 DIAGNOSIS — M25561 Pain in right knee: Secondary | ICD-10-CM | POA: Diagnosis not present

## 2020-07-22 DIAGNOSIS — L03212 Acute lymphangitis of face: Secondary | ICD-10-CM | POA: Diagnosis not present

## 2020-08-18 DIAGNOSIS — M25571 Pain in right ankle and joints of right foot: Secondary | ICD-10-CM | POA: Diagnosis not present

## 2020-08-18 DIAGNOSIS — M25561 Pain in right knee: Secondary | ICD-10-CM | POA: Diagnosis not present

## 2020-09-21 DIAGNOSIS — R0981 Nasal congestion: Secondary | ICD-10-CM | POA: Diagnosis not present

## 2020-09-21 DIAGNOSIS — U071 COVID-19: Secondary | ICD-10-CM | POA: Diagnosis not present

## 2020-09-21 DIAGNOSIS — R059 Cough, unspecified: Secondary | ICD-10-CM | POA: Diagnosis not present

## 2020-11-30 DIAGNOSIS — F410 Panic disorder [episodic paroxysmal anxiety] without agoraphobia: Secondary | ICD-10-CM | POA: Diagnosis not present

## 2020-11-30 DIAGNOSIS — F3181 Bipolar II disorder: Secondary | ICD-10-CM | POA: Diagnosis not present

## 2021-01-17 DIAGNOSIS — Z0000 Encounter for general adult medical examination without abnormal findings: Secondary | ICD-10-CM | POA: Diagnosis not present

## 2021-01-17 DIAGNOSIS — Z23 Encounter for immunization: Secondary | ICD-10-CM | POA: Diagnosis not present

## 2021-01-19 DIAGNOSIS — F339 Major depressive disorder, recurrent, unspecified: Secondary | ICD-10-CM | POA: Diagnosis not present

## 2021-01-19 DIAGNOSIS — Z13220 Encounter for screening for lipoid disorders: Secondary | ICD-10-CM | POA: Diagnosis not present

## 2021-01-19 DIAGNOSIS — Z131 Encounter for screening for diabetes mellitus: Secondary | ICD-10-CM | POA: Diagnosis not present

## 2021-01-19 DIAGNOSIS — F909 Attention-deficit hyperactivity disorder, unspecified type: Secondary | ICD-10-CM | POA: Diagnosis not present

## 2021-01-19 DIAGNOSIS — Z0000 Encounter for general adult medical examination without abnormal findings: Secondary | ICD-10-CM | POA: Diagnosis not present

## 2021-02-03 DIAGNOSIS — K219 Gastro-esophageal reflux disease without esophagitis: Secondary | ICD-10-CM | POA: Diagnosis not present

## 2021-02-03 DIAGNOSIS — K449 Diaphragmatic hernia without obstruction or gangrene: Secondary | ICD-10-CM | POA: Diagnosis not present

## 2021-02-03 DIAGNOSIS — Z1211 Encounter for screening for malignant neoplasm of colon: Secondary | ICD-10-CM | POA: Diagnosis not present

## 2021-02-07 DIAGNOSIS — G4733 Obstructive sleep apnea (adult) (pediatric): Secondary | ICD-10-CM | POA: Diagnosis not present

## 2021-02-07 DIAGNOSIS — F3181 Bipolar II disorder: Secondary | ICD-10-CM | POA: Diagnosis not present

## 2021-02-21 DIAGNOSIS — G4733 Obstructive sleep apnea (adult) (pediatric): Secondary | ICD-10-CM | POA: Diagnosis not present

## 2021-03-24 DIAGNOSIS — G4733 Obstructive sleep apnea (adult) (pediatric): Secondary | ICD-10-CM | POA: Diagnosis not present

## 2021-04-13 DIAGNOSIS — Z1211 Encounter for screening for malignant neoplasm of colon: Secondary | ICD-10-CM | POA: Diagnosis not present

## 2021-04-23 DIAGNOSIS — G4733 Obstructive sleep apnea (adult) (pediatric): Secondary | ICD-10-CM | POA: Diagnosis not present

## 2021-05-07 DIAGNOSIS — M7062 Trochanteric bursitis, left hip: Secondary | ICD-10-CM | POA: Diagnosis not present

## 2021-05-16 DIAGNOSIS — G4733 Obstructive sleep apnea (adult) (pediatric): Secondary | ICD-10-CM | POA: Diagnosis not present

## 2021-05-24 DIAGNOSIS — F900 Attention-deficit hyperactivity disorder, predominantly inattentive type: Secondary | ICD-10-CM | POA: Diagnosis not present

## 2021-05-24 DIAGNOSIS — F3181 Bipolar II disorder: Secondary | ICD-10-CM | POA: Diagnosis not present

## 2021-05-24 DIAGNOSIS — F4321 Adjustment disorder with depressed mood: Secondary | ICD-10-CM | POA: Diagnosis not present

## 2021-05-24 DIAGNOSIS — G4733 Obstructive sleep apnea (adult) (pediatric): Secondary | ICD-10-CM | POA: Diagnosis not present

## 2021-09-01 DIAGNOSIS — G4733 Obstructive sleep apnea (adult) (pediatric): Secondary | ICD-10-CM | POA: Diagnosis not present

## 2021-09-07 DIAGNOSIS — R197 Diarrhea, unspecified: Secondary | ICD-10-CM | POA: Diagnosis not present

## 2021-11-02 DIAGNOSIS — L501 Idiopathic urticaria: Secondary | ICD-10-CM | POA: Diagnosis not present

## 2021-12-16 DIAGNOSIS — F900 Attention-deficit hyperactivity disorder, predominantly inattentive type: Secondary | ICD-10-CM | POA: Diagnosis not present

## 2021-12-16 DIAGNOSIS — F4321 Adjustment disorder with depressed mood: Secondary | ICD-10-CM | POA: Diagnosis not present

## 2021-12-16 DIAGNOSIS — F3181 Bipolar II disorder: Secondary | ICD-10-CM | POA: Diagnosis not present

## 2021-12-25 ENCOUNTER — Other Ambulatory Visit: Payer: Self-pay

## 2021-12-25 ENCOUNTER — Encounter (HOSPITAL_BASED_OUTPATIENT_CLINIC_OR_DEPARTMENT_OTHER): Payer: Self-pay

## 2021-12-25 ENCOUNTER — Emergency Department (HOSPITAL_BASED_OUTPATIENT_CLINIC_OR_DEPARTMENT_OTHER)
Admission: EM | Admit: 2021-12-25 | Discharge: 2021-12-25 | Disposition: A | Discharge status: Home / Self Care | Payer: BC Managed Care – PPO | Attending: Emergency Medicine | Admitting: Emergency Medicine

## 2021-12-25 DIAGNOSIS — L509 Urticaria, unspecified: Secondary | ICD-10-CM

## 2021-12-25 DIAGNOSIS — L03211 Cellulitis of face: Secondary | ICD-10-CM | POA: Diagnosis not present

## 2021-12-25 DIAGNOSIS — L0201 Cutaneous abscess of face: Secondary | ICD-10-CM | POA: Diagnosis not present

## 2021-12-25 LAB — CBC WITH DIFFERENTIAL/PLATELET
Abs Immature Granulocytes: 0.02 10*3/uL (ref 0.00–0.07)
Basophils Absolute: 0 10*3/uL (ref 0.0–0.1)
Basophils Relative: 0 %
Eosinophils Absolute: 0 10*3/uL (ref 0.0–0.5)
Eosinophils Relative: 0 %
HCT: 44.2 % (ref 36.0–46.0)
Hemoglobin: 14.8 g/dL (ref 12.0–15.0)
Immature Granulocytes: 0 %
Lymphocytes Relative: 28 %
Lymphs Abs: 2 10*3/uL (ref 0.7–4.0)
MCH: 29.7 pg (ref 26.0–34.0)
MCHC: 33.5 g/dL (ref 30.0–36.0)
MCV: 88.6 fL (ref 80.0–100.0)
Monocytes Absolute: 0.3 10*3/uL (ref 0.1–1.0)
Monocytes Relative: 5 %
Neutro Abs: 4.7 10*3/uL (ref 1.7–7.7)
Neutrophils Relative %: 67 %
Platelets: 313 10*3/uL (ref 150–400)
RBC: 4.99 MIL/uL (ref 3.87–5.11)
RDW: 13.5 % (ref 11.5–15.5)
WBC: 7 10*3/uL (ref 4.0–10.5)
nRBC: 0 % (ref 0.0–0.2)

## 2021-12-25 LAB — BASIC METABOLIC PANEL
Anion gap: 9 (ref 5–15)
BUN: 21 mg/dL — ABNORMAL HIGH (ref 6–20)
CO2: 28 mmol/L (ref 22–32)
Calcium: 9.6 mg/dL (ref 8.9–10.3)
Chloride: 96 mmol/L — ABNORMAL LOW (ref 98–111)
Creatinine, Ser: 0.93 mg/dL (ref 0.44–1.00)
GFR, Estimated: >60 mL/min (ref >60)
Glucose, Bld: 108 mg/dL — ABNORMAL HIGH (ref 70–99)
Potassium: 4 mmol/L (ref 3.5–5.1)
Sodium: 133 mmol/L — ABNORMAL LOW (ref 135–145)

## 2021-12-25 MED ORDER — LIDOCAINE-EPINEPHRINE (PF) 2 %-1:200000 IJ SOLN
10.0000 mL | Freq: Once | INTRAMUSCULAR | Status: AC
  Administered 2021-12-25: 10 mL
  Filled 2021-12-25: qty 20

## 2021-12-25 MED ORDER — DOXYCYCLINE HYCLATE 100 MG PO CAPS: 100.0000 mg | ORAL_CAPSULE | Freq: Two times a day (BID) | ORAL | 0 refills | Status: AC

## 2021-12-25 MED ORDER — HYDROCODONE-ACETAMINOPHEN 5-325 MG PO TABS
1.0000 | ORAL_TABLET | Freq: Once | ORAL | Status: AC
  Administered 2021-12-25: 1 via ORAL
  Filled 2021-12-25: qty 1

## 2021-12-25 MED ORDER — LACTATED RINGERS IV BOLUS
1000.0000 mL | Freq: Once | INTRAVENOUS | Status: AC
  Administered 2021-12-25: 1000 mL via INTRAVENOUS

## 2021-12-25 MED ORDER — LACTATED RINGERS IV BOLUS: 1000.0000 mL | Freq: Once | INTRAVENOUS | Status: DC

## 2021-12-25 MED ORDER — DIPHENHYDRAMINE HCL 25 MG PO CAPS
25.0000 mg | ORAL_CAPSULE | Freq: Once | ORAL | Status: AC
  Administered 2021-12-25: 25 mg via ORAL
  Filled 2021-12-25: qty 1

## 2021-12-25 MED ORDER — CEPHALEXIN 500 MG PO CAPS: 500.0000 mg | ORAL_CAPSULE | Freq: Four times a day (QID) | ORAL | 0 refills | Status: DC

## 2021-12-25 MED ORDER — CEPHALEXIN 500 MG PO CAPS: 500.0000 mg | ORAL_CAPSULE | Freq: Four times a day (QID) | ORAL | 0 refills | Status: AC

## 2021-12-25 MED ORDER — DOXYCYCLINE HYCLATE 100 MG PO CAPS: 100.0000 mg | ORAL_CAPSULE | Freq: Two times a day (BID) | ORAL | 0 refills | Status: DC

## 2021-12-25 NOTE — ED Notes (Signed)
Discharge instructions, follow up care, and prescriptions reviewed and explained, pt verbalized understanding. Pt caox4 and ambulatory on d/c with husband transporting pt home.

## 2021-12-25 NOTE — ED Triage Notes (Signed)
Pt states that she had a "spot of cystic acne" to L lower face on Friday and put a pimple patch on it. Area now reddened and swollen.

## 2021-12-25 NOTE — ED Provider Notes (Addendum)
[facility] EMERGENCY DEPT Provider Note   CSN: [id number] Arrival date & time: 12/25/21  1613     History  Chief Complaint  Patient presents with   Abscess   Urticaria    Hayley White is a 51 y.o. female.   Abscess Urticaria     51 year old female with medical history significant for previous MRSA infections, obesity, bipolar disorder who presents to the emergency department with concern for left facial redness and swelling and pain.  Additionally, the patient states that she has had an urticarial rash for the past few days that has not improved with Benadryl.  She states that she has tolerated Keflex and doxycycline in the past.  She has an allergy to Bactrim.  She denies any tongue elevation or swelling, difficulty with occlusion of her mouth, neck swelling or difficulty with range of motion.  She feels like she is phonating and swallowing appropriately.  She denies any fevers or chills.  She has been trying warm compresses at home without relief.  Symptoms have ongoing since this past Friday.  Home Medications Prior to Admission medications   Medication Sig Start Date End Date Taking? Authorizing Provider  cephALEXin (KEFLEX) 500 MG capsule Take 1 capsule (500 mg total) by mouth 4 (four) times daily. 12/25/21  Yes Ernie Avena, MD  doxycycline (VIBRAMYCIN) 100 MG capsule Take 1 capsule (100 mg total) by mouth 2 (two) times daily. 12/25/21  Yes Ernie Avena, MD  buPROPion [facility] SR) 200 MG 12 hr tablet Take 200 mg by mouth 2 (two) times daily. Take two times a day  Per patient    [provider]  cloNIDine (CATAPRES) 0.2 MG tablet Take 0.2 mg by mouth at bedtime.     [provider]  CRANBERRY PO Take 8 tablets by mouth daily. Take 8 tablets every day per patient    [provider]  diphenhydrAMINE (BENADRYL) 25 mg capsule Take 25 mg by mouth at bedtime. Take every day per patient    [provider]  fluconazole  (DIFLUCAN) 150 MG tablet Take 1 tablet (150 mg total) by mouth once. 10/10/14   Muthersbaugh, Dahlia Client, PA-C  HYDROcodone-acetaminophen (NORCO/VICODIN) 5-325 MG per tablet Take 1-2 tablets by mouth every 6 (six) hours as needed for moderate pain or severe pain. 10/10/14   Muthersbaugh, Dahlia Client, PA-C  Ibuprofen 200 MG CAPS Take 1 capsule by mouth daily.    [provider]  lamoTRIgine (LAMICTAL) 150 MG tablet Take 150 mg by mouth daily.    [provider]  loratadine (CLARITIN) 10 MG tablet Take 10 mg by mouth daily.    [provider]  omeprazole (PRILOSEC) 20 MG capsule Take 20 mg by mouth daily.    [provider]  ondansetron (ZOFRAN ODT) 4 MG disintegrating tablet 4mg  ODT q4 hours prn nausea/vomit 10/10/14   Muthersbaugh, 12/10/14, PA-C  predniSONE (STERAPRED UNI-PAK 21 TAB) 10 MG (21) TBPK tablet Take by mouth daily. Take 6 tabs by mouth daily  for 2 days, then 5 tabs for 2 days, then 4 tabs for 2 days, then 3 tabs for 2 days, 2 tabs for 2 days, then 1 tab by mouth daily for 2 days 06/23/18   06/25/18, MD  pseudoephedrine (SUDAFED) 120 MG 12 hr tablet Take 120 mg by mouth daily.    [provider]  traZODone (DESYREL) 50 MG tablet Take 50 mg by mouth at bedtime.    [provider]      Allergies  Amoxicillin-pot clavulanate and Sulfamethoxazole-trimethoprim    Review of Systems   Review of Systems  All other systems reviewed and are negative.   Physical Exam Updated Vital Signs BP 122/80 (BP Location: Right Wrist)   Pulse 99   Temp 98.8 F (37.1 C)   Resp (!) 22   Ht 5\' 4"  (1.626 m)   Wt 131.5 kg   SpO2 97%   BMI 49.78 kg/m  Physical Exam Vitals and nursing note reviewed.  Constitutional:      General: She is not in acute distress. HENT:     Head: Normocephalic and atraumatic.     Comments: Left jaw with roughly quarter sized area of erythema, skin thickening, tenderness to palpation, no fluctuance or crepitus Eyes:      Conjunctiva/sclera: Conjunctivae normal.     Pupils: Pupils are equal, round, and reactive to light.  Cardiovascular:     Rate and Rhythm: Normal rate and regular rhythm.  Pulmonary:     Effort: Pulmonary effort is normal. No respiratory distress.  Abdominal:     General: There is no distension.     Tenderness: There is no guarding.  Musculoskeletal:        General: No deformity or signs of injury.     Cervical back: Neck supple.  Skin:    Findings: No lesion or rash.  Neurological:     General: No focal deficit present.     Mental Status: She is alert. Mental status is at baseline.     ED Results / Procedures / Treatments   Labs (all labs ordered are listed, but only abnormal results are displayed) Labs Reviewed  BASIC METABOLIC PANEL - Abnormal; Notable for the following components:      Result Value   Sodium 133 (*)    Chloride 96 (*)    Glucose, Bld 108 (*)    BUN 21 (*)    All other components within normal limits  CULTURE, BLOOD (ROUTINE X 2)  CULTURE, BLOOD (ROUTINE X 2)  CULTURE, BLOOD (SINGLE)  CBC WITH DIFFERENTIAL/PLATELET    EKG None  Radiology No results found.  Procedures Ultrasound ED Soft Tissue  Date/Time: 12/25/2021 7:16 PM  Performed by: 12/27/2021, MD Authorized by: Ernie Avena, MD   Procedure details:    Indications: localization of abscess and evaluate for cellulitis     Transverse view:  Visualized   Longitudinal view:  Visualized   Images: not archived   Location:    Location: face     Side:  Left Findings:     no abscess present    cellulitis present    no foreign body present .Ernie AvenaIncision and Drainage  Date/Time: 12/25/2021 7:17 PM  Performed by: 12/27/2021, MD Authorized by: Ernie Avena, MD   Consent:    Consent obtained:  Verbal   Consent given by:  Patient   Risks discussed:  Bleeding and incomplete drainage   Alternatives discussed:  No treatment Universal protocol:    Patient identity confirmed:   Verbally with patient Location:    Type:  Abscess   Size:  2cm   Location:  Head   Head location:  Face Pre-procedure details:    Skin preparation:  Chlorhexidine Sedation:    Sedation type:  None Anesthesia:    Anesthesia method:  Local infiltration   Local anesthetic:  Lidocaine 1% WITH epi Procedure type:    Complexity:  Simple Procedure details:    Ultrasound guidance: yes     Needle aspiration: yes  Needle size:  25 G   Drainage amount:  Scant Post-procedure details:    Procedure completion:  Tolerated     Medications Ordered in ED Medications  diphenhydrAMINE (BENADRYL) capsule 25 mg (has no administration in time range)  lidocaine-EPINEPHrine (XYLOCAINE W/EPI) 2 %-1:200000 (PF) injection 10 mL (10 mLs Infiltration Given 12/25/21 1821)  lactated ringers bolus 1,000 mL (1,000 mLs Intravenous New Bag/Given 12/25/21 1821)    ED Course/ Medical Decision Making/ A&P Clinical Course as of 12/25/21 1919  Sun Dec 25, 2021  1748 Pulse Rate: 99 [JL]  1748 Resp(!): 22 [JL]    Clinical Course User Index [JL] Ernie Avena, MD                           Medical Decision Making Amount and/or Complexity of Data Reviewed Labs: ordered.  Risk Prescription drug management.     51 year old female with medical history significant for previous MRSA infections, obesity, bipolar disorder who presents to the emergency department with concern for left facial redness and swelling and pain.  Additionally, the patient states that she has had an urticarial rash for the past few days that has not improved with Benadryl.  She states that she has tolerated Keflex and doxycycline in the past.  She has an allergy to Bactrim.  She denies any tongue elevation or swelling, difficulty with occlusion of her mouth, neck swelling or difficulty with range of motion.  She feels like she is phonating and swallowing appropriately.  She denies any fevers or chills.  She has been trying warm compresses at  home without relief.  Symptoms have ongoing since this past Friday.  On arrival, the patient was vitally stable, afebrile, temperature 99, RR 22, BP 122/80, O2 saturations 97% on room air.  Sinus rhythm noted on cardiac telemetry.  Physical exam concerning for left-sided facial cellulitis with no clear evidence of abscess.  Point-of-care ultrasound did not reveal evidence of abscess, evidence of soft tissue changes concerning for developing cellulitis and a needle aspiration revealed no significant fluid aspirated.  The patient's history of MRSA infection, labs were obtained which revealed CBC without a leukocytosis or anemia, BMP with mild hyponatremia 133, BUN to 21, otherwise generally unremarkable.  She was administered a single dose of IV Rocephin and blood cultures were drawn.  She was administered an IV fluid bolus.  With her history of MRSA, we will discharge her on Keflex and doxycycline with strict return precautions in the event of worsening symptoms despite outpatient antibiotics.  Patient overall stable at time of discharge.  Administered Benadryl for urticaria and advised to continue the same at home.  Stable for PCP follow-up with return precautions provided.  Final Clinical Impression(s) / ED Diagnoses Final diagnoses:  Facial cellulitis  Urticaria    Rx / DC Orders ED Discharge Orders          Ordered    cephALEXin (KEFLEX) 500 MG capsule  4 times daily        12/25/21 1913    doxycycline (VIBRAMYCIN) 100 MG capsule  2 times daily        12/25/21 1913              Ernie Avena, MD 12/25/21 1919    Ernie Avena, MD 12/25/21 1919

## 2021-12-25 NOTE — Discharge Instructions (Addendum)
Your ultrasound bedside and needle aspiration revealed no evidence of abscess.  Your symptoms are consistent with facial cellulitis for which we will treat with Keflex and doxycycline.  If you do not improve and experience worsening symptoms despite outpatient antibiotics you may need admission for IV antibiotics.  Recommend continued Benadryl for your hives.

## 2021-12-25 NOTE — ED Triage Notes (Signed)
Pt also endorses BIL LE hives since today. Pt has taken 2 benadryl and hydroxyzine with no relief.

## 2021-12-30 LAB — CULTURE, BLOOD (ROUTINE X 2)
Culture: NO GROWTH
Special Requests: ADEQUATE

## 2022-01-18 DIAGNOSIS — K219 Gastro-esophageal reflux disease without esophagitis: Secondary | ICD-10-CM | POA: Diagnosis not present

## 2022-01-18 DIAGNOSIS — F3181 Bipolar II disorder: Secondary | ICD-10-CM | POA: Diagnosis not present

## 2022-01-18 DIAGNOSIS — Z23 Encounter for immunization: Secondary | ICD-10-CM | POA: Diagnosis not present

## 2022-01-18 DIAGNOSIS — Z13220 Encounter for screening for lipoid disorders: Secondary | ICD-10-CM | POA: Diagnosis not present

## 2022-01-18 DIAGNOSIS — F909 Attention-deficit hyperactivity disorder, unspecified type: Secondary | ICD-10-CM | POA: Diagnosis not present

## 2022-01-18 DIAGNOSIS — Z0000 Encounter for general adult medical examination without abnormal findings: Secondary | ICD-10-CM | POA: Diagnosis not present

## 2022-01-18 DIAGNOSIS — F411 Generalized anxiety disorder: Secondary | ICD-10-CM | POA: Diagnosis not present

## 2022-01-24 ENCOUNTER — Other Ambulatory Visit: Payer: Self-pay | Admitting: Internal Medicine

## 2022-01-24 DIAGNOSIS — Z1231 Encounter for screening mammogram for malignant neoplasm of breast: Secondary | ICD-10-CM

## 2022-02-01 DIAGNOSIS — Z30431 Encounter for routine checking of intrauterine contraceptive device: Secondary | ICD-10-CM | POA: Diagnosis not present

## 2022-02-01 DIAGNOSIS — N911 Secondary amenorrhea: Secondary | ICD-10-CM | POA: Diagnosis not present

## 2022-02-21 ENCOUNTER — Ambulatory Visit: Payer: BC Managed Care – PPO

## 2022-02-24 ENCOUNTER — Ambulatory Visit: Payer: BC Managed Care – PPO | Admitting: Internal Medicine

## 2022-02-24 NOTE — Progress Notes (Deleted)
NEW PATIENT  Date of Service/Encounter:  02/24/22  Consult requested by: Lennie Odor, PA   Subjective:   Hayley White (DOB: 03-Mar-1971) is a 51 y.o. female who presents to the clinic on 02/24/2022 with a chief complaint of No chief complaint on file. Marland Kitchen    History obtained from: chart review and {Persons; PED relatives w/patient:19415::"patient"}.   Asthma:  Diagnosed at age ***.  Current symptoms include {Blank multiple:19196::"***","chest tightness","cough","shortness of breath","wheezing"} *** daytime symptoms in past month, *** nighttime awakenings in past month Using rescue inhaler *** Limitations to daily activity: {Blank single:19197::"none","mild","some","severe"} *** ED visits, *** UC visits and *** oral steroids in the past year *** number of lifetime hospitalizations, *** number of lifetime intubations.  Identified Triggers: {Blank multiple:19196:a:"***","exercise","respiratory illness","smoke exposure","cold air"} Prior PFTs or spirometry: *** Previously used therapies: ***.  Current regimen:  Maintenance: *** Rescue: Albuterol 2 puffs q4-6 hrs PRN, *** prior to exercise  Up-to-date with {Blank multiple:19196:a:"***","pneumonia,","Covid-19,","Flu,"} vaccines. History of prior pneumonias: *** History of prior COVID-19 infection: *** Smoking exposure: ***  Rhinitis:  Started *** Symptoms include: {Blank multiple:19196:a:"***","nasal congestion","rhinorrhea","post nasal drainage","sneezing","watery eyes","itchy eyes","itchy nose"}  Occurs {Blank single:19197::"year-round","seasonally-***","year-round with seasonal flares","***"} Potential triggers: *** Treatments tried: *** Previous allergy testing: {Blank single:19197::"yes","no"} History of reflux/heartburn: {Blank single:19197::"yes","no"} History of chronic sinusitis or sinus surgery: {Blank single:19197::"yes","no"} Nonallergic triggers: {Blank multiple:19196:a:"***","strong odors","smoke","spicy  food","alcohol"}   Atopic Dermatitis:  Diagnosed at age *** Areas that flare commonly are ***. Previous therapies tried *** Current regimen: ***   Reports *** of fragrance/dye free products Identified triggers of flares include *** Sleep *** affected  Eosinophilic Esophagitis: Diagnosed at:*** Symptoms: *** Triggers: *** Tolerating foods: *** Testing:  *** Previous treatments: ***  Concern for Food Allergy:  Foods of concern: *** History of reaction: *** Previous allergy testing {Blank single:19197::"yes","no"} Eats egg, dairy, wheat, soy, fish, shellfish, peanuts, tree nuts, sesame without reactions*** Carries an epinephrine autoinjector: {Blank single:19197::"yes","no"}  Concern for Stinging Insect Allergy: Insect of concern: *** History of reaction: *** Previous testing: *** Carries an epinephrine autoinjector: {Blank single:19197::"yes","no"}  Concern for Drug Allergy: Drug of concern: *** History of reaction: *** Previous testing: *** Carries an epinephrine autoinjector: {Blank single:19197::"yes","no"}  Chronic Urticaria/Angioedema: Symptoms: *** Angioedema Episodes: *** ER visits/clinic visits/hospitalization: *** Triggers: ***  Recurrent Infections:  Age of onset: *** Types of infections: *** Antibiotic use: *** Hospitalization or ER visits: *** Testing performed: ***   Past Medical History: Past Medical History:  Diagnosis Date   ADD (attention deficit disorder)    Bipolar 1 disorder ([place])    Hiatal hernia    Obesity    Seasonal allergies     Birth History:  {Blank single:19197::"non-contributory","born premature and spent time in the NICU","born at term without complications"}  Past Surgical History: Past Surgical History:  Procedure Laterality Date   CHOLECYSTECTOMY      Family History: No family history on file.  Social History:  Lives in a *** year {Blank single:19197::"house","condo","apartment","townhouse"} Flooring in  bedroom: {Blank single:19197::"carpet","tile","wood","laminate"} Pets: {Blank multiple:19196::"***","cat","dog","other"} Tobacco use/exposure: *** Job: ***  Medication List:  Allergies as of 02/24/2022       Reactions   Amoxicillin-pot Clavulanate Diarrhea   Sulfamethoxazole-trimethoprim Other (See Comments)   Tongue swelling        Medication List        Accurate as of February 24, 2022  1:24 PM. If you have any questions, ask your nurse or doctor.          buPROPion 200 MG 12 hr tablet Commonly  known as: WELLBUTRIN SR Take 200 mg by mouth 2 (two) times daily. Take two times a day  Per patient   cephALEXin 500 MG capsule Commonly known as: KEFLEX Take 1 capsule (500 mg total) by mouth 4 (four) times daily.   cloNIDine 0.2 MG tablet Commonly known as: CATAPRES Take 0.2 mg by mouth at bedtime.   CRANBERRY PO Take 8 tablets by mouth daily. Take 8 tablets every day per patient   diphenhydrAMINE 25 mg capsule Commonly known as: BENADRYL Take 25 mg by mouth at bedtime. Take every day per patient   doxycycline 100 MG capsule Commonly known as: VIBRAMYCIN Take 1 capsule (100 mg total) by mouth 2 (two) times daily.   fluconazole 150 MG tablet Commonly known as: Diflucan Take 1 tablet (150 mg total) by mouth once.   HYDROcodone-acetaminophen 5-325 MG tablet Commonly known as: NORCO/VICODIN Take 1-2 tablets by mouth every 6 (six) hours as needed for moderate pain or severe pain.   Ibuprofen 200 MG Caps Take 1 capsule by mouth daily.   lamoTRIgine 150 MG tablet Commonly known as: LAMICTAL Take 150 mg by mouth daily.   loratadine 10 MG tablet Commonly known as: CLARITIN Take 10 mg by mouth daily.   omeprazole 20 MG capsule Commonly known as: PRILOSEC Take 20 mg by mouth daily.   ondansetron 4 MG disintegrating tablet Commonly known as: Zofran ODT 4mg  ODT q4 hours prn nausea/vomit   predniSONE 10 MG (21) Tbpk tablet Commonly known as: STERAPRED UNI-PAK  21 TAB Take by mouth daily. Take 6 tabs by mouth daily  for 2 days, then 5 tabs for 2 days, then 4 tabs for 2 days, then 3 tabs for 2 days, 2 tabs for 2 days, then 1 tab by mouth daily for 2 days   pseudoephedrine 120 MG 12 hr tablet Commonly known as: SUDAFED Take 120 mg by mouth daily.   traZODone 50 MG tablet Commonly known as: DESYREL Take 50 mg by mouth at bedtime.         REVIEW OF SYSTEMS: Pertinent positives and negatives discussed in HPI.   Objective:   Physical Exam: There were no vitals taken for this visit. There is no height or weight on file to calculate BMI. GEN: alert, well developed HEENT: clear conjunctiva, TM grey and translucent, nose with + inferior turbinate hypertrophy, pale nasal mucosa, slight clear rhinorrhea, + cobblestoning HEART: regular rate and rhythm, no murmur LUNGS: clear to auscultation bilaterally, no coughing, unlabored respiration ABDOMEN: soft, non distended  SKIN: no rashes or lesions  Reviewed: ***  Spirometry:  Tracings reviewed. Her effort: {Blank single:19197::"Good reproducible efforts.","It was hard to get consistent efforts and there is a question as to whether this reflects a maximal maneuver.","Poor effort, data can not be interpreted.","Variable effort-results affected.","decent for first attempt at spirometry."} FVC: ***L FEV1: ***L, ***% predicted FEV1/FVC ratio: ***% Interpretation: {Blank single:19197::"Spirometry consistent with mild obstructive disease","Spirometry consistent with moderate obstructive disease","Spirometry consistent with severe obstructive disease","Spirometry consistent with possible restrictive disease","Spirometry consistent with mixed obstructive and restrictive disease","Spirometry uninterpretable due to technique","Spirometry consistent with normal pattern","No overt abnormalities noted given today's efforts"}.  Please see scanned spirometry results for details.  Skin Testing:  Skin prick testing  was placed, which includes aeroallergens/foods, histamine control, and saline control.  Verbal consent was obtained prior to placing test.  We discussed risks including anaphylaxis. Patient tolerated procedure well.  Allergy testing results were read and interpreted by myself, documented by clinical staff. Adequate positive and negative control.  Results discussed with patient/family.     Assessment:   No diagnosis found.  Plan/Recommendations:    There are no Patient Instructions on file for this visit.    No follow-ups on file.  Alesia Morin, MD [facility]

## 2022-02-28 DIAGNOSIS — L03311 Cellulitis of abdominal wall: Secondary | ICD-10-CM | POA: Diagnosis not present

## 2022-04-03 NOTE — Progress Notes (Deleted)
NEW PATIENT Date of Service/Encounter:  04/03/22 Referring provider: Ollen Bowl, MD Primary care provider: Milus Height, PA  Subjective:  Hayley White is a 51 y.o. female with a PMHx of MRSA infections, obestiry, bipolar presenting today for evaluation of hives. History obtained from: chart review and {Persons; PED relatives w/patient:19415::"patient"}.   Rash: started ***, occurring *** Associates *** Denies other systemic symptoms including no respiratory, gastrointestinal or cardiovascular distress. *** changes to personal care products, detergents, diet, etc Therapies tried: *** Potential triggers: *** Does *** associate fever, joint pain, joint swelling, weight loss.  Lesions resolve in ***  *** bruising on resolution. *** recent illness. Pictures reviewed *** Chart review: ED visit on 12/25/21-facial cellulitis but reported hives ongoing for several days prior to arrival. Other allergy screening: Asthma: {Blank single:19197::"yes","no"} Rhino conjunctivitis: {Blank single:19197::"yes","no"} Food allergy: {Blank single:19197::"yes","no"} Medication allergy: {Blank single:19197::"yes","no"} Hymenoptera allergy: {Blank single:19197::"yes","no"} Urticaria: {Blank single:19197::"yes","no"} Eczema:{Blank single:19197::"yes","no"} History of recurrent infections suggestive of immunodeficency: {Blank single:19197::"yes","no"} ***Vaccinations are up to date.   Past Medical History: Past Medical History:  Diagnosis Date   ADD (attention deficit disorder)    Bipolar 1 disorder (HCC)    Hiatal hernia    Obesity    Seasonal allergies    Medication List:  Current Outpatient Medications  Medication Sig Dispense Refill   buPROPion (WELLBUTRIN SR) 200 MG 12 hr tablet Take 200 mg by mouth 2 (two) times daily. Take two times a day  Per patient     cephALEXin (KEFLEX) 500 MG capsule Take 1 capsule (500 mg total) by mouth 4 (four) times daily. 20 capsule 0    cloNIDine (CATAPRES) 0.2 MG tablet Take 0.2 mg by mouth at bedtime.      CRANBERRY PO Take 8 tablets by mouth daily. Take 8 tablets every day per patient     diphenhydrAMINE (BENADRYL) 25 mg capsule Take 25 mg by mouth at bedtime. Take every day per patient     doxycycline (VIBRAMYCIN) 100 MG capsule Take 1 capsule (100 mg total) by mouth 2 (two) times daily. 20 capsule 0   fluconazole (DIFLUCAN) 150 MG tablet Take 1 tablet (150 mg total) by mouth once. 1 tablet 0   HYDROcodone-acetaminophen (NORCO/VICODIN) 5-325 MG per tablet Take 1-2 tablets by mouth every 6 (six) hours as needed for moderate pain or severe pain. 15 tablet 0   Ibuprofen 200 MG CAPS Take 1 capsule by mouth daily.     lamoTRIgine (LAMICTAL) 150 MG tablet Take 150 mg by mouth daily.     loratadine (CLARITIN) 10 MG tablet Take 10 mg by mouth daily.     omeprazole (PRILOSEC) 20 MG capsule Take 20 mg by mouth daily.     ondansetron (ZOFRAN ODT) 4 MG disintegrating tablet 4mg  ODT q4 hours prn nausea/vomit 4 tablet 0   predniSONE (STERAPRED UNI-PAK 21 TAB) 10 MG (21) TBPK tablet Take by mouth daily. Take 6 tabs by mouth daily  for 2 days, then 5 tabs for 2 days, then 4 tabs for 2 days, then 3 tabs for 2 days, 2 tabs for 2 days, then 1 tab by mouth daily for 2 days 42 tablet 0   pseudoephedrine (SUDAFED) 120 MG 12 hr tablet Take 120 mg by mouth daily.     traZODone (DESYREL) 50 MG tablet Take 50 mg by mouth at bedtime.     No current facility-administered medications for this visit.   Known Allergies:  Allergies  Allergen Reactions   Amoxicillin-Pot Clavulanate Diarrhea  Sulfamethoxazole-Trimethoprim Other (See Comments)    Tongue swelling   Past Surgical History: Past Surgical History:  Procedure Laterality Date   CHOLECYSTECTOMY     Family History: No family history on file. Social History: Zohal lives ***.   ROS:  All other systems negative except as noted per HPI.  Objective:  There were no vitals taken for this  visit. There is no height or weight on file to calculate BMI. Physical Exam:  General Appearance:  Alert, cooperative, no distress, appears stated age  Head:  Normocephalic, without obvious abnormality, atraumatic  Eyes:  Conjunctiva clear, EOM's intact  Nose: Nares normal, {Blank multiple:19196:a:"***","hypertrophic turbinates","normal mucosa","no visible anterior polyps","septum midline"}  Throat: Lips, tongue normal; teeth and gums normal, {Blank multiple:19196:a:"***","normal posterior oropharynx","tonsils 2+","tonsils 3+","no tonsillar exudate","+ cobblestoning"}  Neck: Supple, symmetrical  Lungs:   {Blank multiple:19196:a:"***","clear to auscultation bilaterally","end-expiratory wheezing","wheezing throughout"}, Respirations unlabored, {Blank multiple:19196:a:"***","no coughing","intermittent dry coughing"}  Heart:  {Blank multiple:19196:a:"***","regular rate and rhythm","no murmur"}, Appears well perfused  Extremities: No edema  Skin: Skin color, texture, turgor normal, no rashes or lesions on visualized portions of skin  Neurologic: No gross deficits     Diagnostics: Spirometry:  Tracings reviewed. Her effort: {Blank single:19197::"Good reproducible efforts.","It was hard to get consistent efforts and there is a question as to whether this reflects a maximal maneuver.","Poor effort, data can not be interpreted.","Variable effort-results affected.","decent for first attempt at spirometry."} FVC: ***L (pre), ***L  (post) FEV1: ***L, ***% predicted (pre), ***L, ***% predicted (post) FEV1/FVC ratio: *** (pre), *** (post) Interpretation: {Blank single:19197::"Spirometry consistent with mild obstructive disease","Spirometry consistent with moderate obstructive disease","Spirometry consistent with severe obstructive disease","Spirometry consistent with possible restrictive disease","Spirometry consistent with mixed obstructive and restrictive disease","Spirometry uninterpretable due to  technique","Spirometry consistent with normal pattern","No overt abnormalities noted given today's efforts"} with *** bronchodilator response  Skin Testing: {Blank single:19197::"Select foods","Environmental allergy panel","Environmental allergy panel and select foods","Food allergy panel","None","Deferred due to recent antihistamines use"}. *** Adequate controls. Results discussed with patient/family.   {Blank single:19197::"Allergy testing results were read and interpreted by myself, documented by clinical staff."," "}  Assessment and Plan  ***  {Blank single:19197::"This note in its entirety was forwarded to the Provider who requested this consultation."}  Thank you for your kind referral. I appreciate the opportunity to take part in Shatasha's care. Please do not hesitate to contact me with questions.***  Sincerely,  Tonny Bollman, MD [facility]

## 2022-04-05 ENCOUNTER — Ambulatory Visit: Payer: BC Managed Care – PPO | Admitting: Internal Medicine

## 2022-04-10 ENCOUNTER — Ambulatory Visit: Payer: BC Managed Care – PPO

## 2022-06-02 ENCOUNTER — Ambulatory Visit: Payer: BC Managed Care – PPO

## 2022-06-08 DIAGNOSIS — R41840 Attention and concentration deficit: Secondary | ICD-10-CM | POA: Diagnosis not present

## 2022-06-08 DIAGNOSIS — F411 Generalized anxiety disorder: Secondary | ICD-10-CM | POA: Diagnosis not present

## 2022-06-08 DIAGNOSIS — F3181 Bipolar II disorder: Secondary | ICD-10-CM | POA: Diagnosis not present

## 2022-06-08 DIAGNOSIS — Z79899 Other long term (current) drug therapy: Secondary | ICD-10-CM | POA: Diagnosis not present

## 2022-07-21 ENCOUNTER — Ambulatory Visit
Admission: RE | Admit: 2022-07-21 | Discharge: 2022-07-21 | Disposition: A | Discharge status: Home / Self Care | Payer: Medicaid Other | Source: Ambulatory Visit | Attending: Internal Medicine | Admitting: Internal Medicine

## 2022-07-21 DIAGNOSIS — Z1231 Encounter for screening mammogram for malignant neoplasm of breast: Secondary | ICD-10-CM

## 2022-12-20 ENCOUNTER — Ambulatory Visit (HOSPITAL_COMMUNITY)
Admission: EM | Admit: 2022-12-20 | Discharge: 2022-12-20 | Disposition: A | Discharge status: Home / Self Care | Payer: Medicaid Other | Attending: Family Medicine | Admitting: Family Medicine

## 2022-12-20 ENCOUNTER — Encounter (HOSPITAL_COMMUNITY): Payer: Self-pay | Admitting: *Deleted

## 2022-12-20 DIAGNOSIS — S81012A Laceration without foreign body, left knee, initial encounter: Secondary | ICD-10-CM

## 2022-12-20 DIAGNOSIS — M25562 Pain in left knee: Secondary | ICD-10-CM

## 2022-12-20 MED ORDER — LIDOCAINE HCL (PF) 1 % IJ SOLN
INTRAMUSCULAR | Status: AC
  Filled 2022-12-20: qty 30

## 2022-12-20 NOTE — ED Provider Notes (Signed)
  [facility]   [id number] 12/20/22 Arrival Time: 4098  ASSESSMENT & PLAN:  1. Knee laceration, left, initial encounter   2. Acute pain of left knee     Procedure: Laceration Repair Verbal consent obtained. Patient provided with risks and alternatives to the procedure. Wound copiously irrigated with NS then cleansed with betadine. Local anesthesia: Lidocaine 1% without epinephrine. Wound carefully explored. No foreign body, tendon injury, or nonviable tissue were noted. Using sterile technique, 3 simple interrupted 4-0 Prolene sutures along with 1 vertical mattress 4-0 Prolene suture were placed to reapproximate the wound. Procedure tolerated well. No complications. Minimal bleeding. Advised to look for and return for any signs of infection such as redness, swelling, discharge, or worsening pain. Return for suture removal in 7-10 days. Declines Tdap.    Discharge Instructions      If not allergic, you may use over the counter ibuprofen or acetaminophen as needed.    Reviewed expectations re: course of current medical issues. Questions answered. Outlined signs and symptoms indicating need for more acute intervention. Patient verbalized understanding. After Visit Summary given.   SUBJECTIVE:  Hayley White is a 52 y.o. female who presents with a laceration of L knee; fall on gravel today. Normal ambulation. Feels she washed the wound well before arrival. Minimal bleeding. No extremity sensation changes or weakness. Td about 6 y ago.   OBJECTIVE:  Vitals:   12/20/22 0948  BP: (!) 151/84  Pulse: 92  Resp: 18  Temp: 98.3 F (36.8 C)  TempSrc: Oral  SpO2: 94%     General appearance: alert; no distress LEFT knee: abrasions over anterior knee; approx 3 cm jagged laceration of variable depth extending over anterior knee; minimal bleeding; normal knee ROM; no specific bony TTP; no FB visible in wound Gait: normal; normal distal sensation of LLE Psychological:  alert and cooperative; normal mood and affect    Labs Reviewed - No data to display  No results found.  Allergies  Allergen Reactions   Amoxicillin-Pot Clavulanate Diarrhea   Sulfamethoxazole-Trimethoprim Other (See Comments)    Tongue swelling    Past Medical History:  Diagnosis Date   ADD (attention deficit disorder)    Bipolar 1 disorder (HCC)    Hiatal hernia    Obesity    Seasonal allergies    Social History   Socioeconomic History   Marital status: Media planner    Spouse name: Not on file   Number of children: Not on file   Years of education: Not on file   Highest education level: Not on file  Occupational History   Not on file  Tobacco Use   Smoking status: Former    Types: Cigarettes, E-cigarettes   Smokeless tobacco: Never  Vaping Use   Vaping status: Every Day  Substance and Sexual Activity   Alcohol use: Yes    Comment: occ   Drug use: Yes    Types: Marijuana   Sexual activity: Yes    Birth control/protection: I.U.D.  Other Topics Concern   Not on file  Social History Narrative   Not on file   Social Determinants of Health   Financial Resource Strain: Not on file  Food Insecurity: Not on file  Transportation Needs: Not on file  Physical Activity: Not on file  Stress: Not on file  Social Connections: Not on file          Mardella Layman, MD 12/20/22 1132

## 2022-12-20 NOTE — ED Triage Notes (Signed)
Pt states she fell in some gravel this morning and is worried she might need stitches on her left knee. She has some abrasions on both knees.

## 2022-12-20 NOTE — Discharge Instructions (Signed)
If not allergic, you may use over the counter ibuprofen or acetaminophen as needed. ° °

## 2023-01-23 ENCOUNTER — Other Ambulatory Visit (HOSPITAL_COMMUNITY): Payer: Self-pay | Admitting: Internal Medicine

## 2023-01-23 DIAGNOSIS — Z136 Encounter for screening for cardiovascular disorders: Secondary | ICD-10-CM

## 2023-02-20 ENCOUNTER — Encounter (HOSPITAL_COMMUNITY): Payer: Self-pay

## 2023-02-20 ENCOUNTER — Ambulatory Visit (HOSPITAL_COMMUNITY)
Admission: RE | Admit: 2023-02-20 | Discharge: 2023-02-20 | Disposition: A | Discharge status: Home / Self Care | Payer: Self-pay | Source: Ambulatory Visit | Attending: Internal Medicine | Admitting: Internal Medicine

## 2023-02-20 DIAGNOSIS — Z136 Encounter for screening for cardiovascular disorders: Secondary | ICD-10-CM | POA: Insufficient documentation

## 2023-03-05 ENCOUNTER — Ambulatory Visit: Payer: Medicaid Other | Admitting: Podiatry

## 2023-03-19 ENCOUNTER — Ambulatory Visit: Payer: Medicaid Other | Admitting: Podiatry

## 2023-03-19 ENCOUNTER — Encounter: Payer: Self-pay | Admitting: Podiatry

## 2023-03-19 ENCOUNTER — Ambulatory Visit (INDEPENDENT_AMBULATORY_CARE_PROVIDER_SITE_OTHER): Payer: Medicaid Other

## 2023-03-19 DIAGNOSIS — M7662 Achilles tendinitis, left leg: Secondary | ICD-10-CM

## 2023-03-19 DIAGNOSIS — M7661 Achilles tendinitis, right leg: Secondary | ICD-10-CM

## 2023-03-19 DIAGNOSIS — E7800 Pure hypercholesterolemia, unspecified: Secondary | ICD-10-CM | POA: Insufficient documentation

## 2023-03-19 MED ORDER — METHYLPREDNISOLONE 4 MG PO TBPK: ORAL_TABLET | ORAL | 0 refills | Status: AC

## 2023-03-19 NOTE — Progress Notes (Signed)
  Subjective:  Patient ID: Hayley White, female    DOB: 1970/09/13,   MRN: [id number]  Chief Complaint  Patient presents with   Foot Pain    Achilles bilateral (R>L) - aching and radiating pain from achilles into heel and arch x several months, does take meloxicam, intermittent pain x 2 years, but just worsened after rolling her ankle last Thanksgiving, also bumped it on a coffee table   New Patient (Initial Visit)    52 y.o. female presents for concern as above. History reviewed.  . Denies any other pedal complaints. Denies n/v/f/c.   Past Medical History:  Diagnosis Date   ADD (attention deficit disorder)    Bipolar 1 disorder (HCC)    Hiatal hernia    Obesity    Seasonal allergies     Objective:  Physical Exam: Vascular: DP/PT pulses 2/4 bilateral. CFT <3 seconds. Normal hair growth on digits. No edema.  Skin. No lacerations or abrasions bilateral feet.  Musculoskeletal: MMT 5/5 bilateral lower extremities in DF, PF, Inversion and Eversion. Deceased ROM in DF of ankle joint. Tender to posterior calcaneus in the area of the achilles insertion. Minimal pain along watershed area. No pain to medial calcaneal tubercle. No pain with calcaneal squeeze.  Neurological: Sensation intact to light touch.   Assessment:   1. Achilles tendinitis of both lower extremities      Plan:  Patient was evaluated and treated and all questions answered. -Xrays reviewed. No acute fractures or dislocations. Spurring noted to posterior calcaneus bilateral.  -Discussed Achilles insertional tendonitis and treatment options with patient.  -Discussed stretching exercises. -Rx Medrol dose pack provided. Did discuss meloxicam and BUN increased previously with lnog term use.  -Heel lifts provided and discussed proper shoewear.  -Discussed if no improvement will consider MRI/PT/EPAT/PRP injections.  -Patient to return to office in 6 weeks for recheck.    Louann Sjogren, DPM

## 2023-03-19 NOTE — Patient Instructions (Signed)

## 2023-04-30 ENCOUNTER — Ambulatory Visit: Payer: Medicaid Other | Admitting: Podiatry

## 2023-06-04 ENCOUNTER — Ambulatory Visit: Payer: Medicaid Other | Admitting: Podiatry

## 2023-07-16 ENCOUNTER — Encounter: Payer: Self-pay | Admitting: Podiatry

## 2023-07-16 ENCOUNTER — Ambulatory Visit (INDEPENDENT_AMBULATORY_CARE_PROVIDER_SITE_OTHER): Payer: Medicaid Other | Admitting: Podiatry

## 2023-07-16 DIAGNOSIS — M7662 Achilles tendinitis, left leg: Secondary | ICD-10-CM | POA: Diagnosis not present

## 2023-07-16 DIAGNOSIS — M7661 Achilles tendinitis, right leg: Secondary | ICD-10-CM | POA: Diagnosis not present

## 2023-07-16 NOTE — Progress Notes (Signed)
  Subjective:  Patient ID: Hayley White, female    DOB: 02-Dec-1970,   MRN: [id number]  No chief complaint on file.   53 y.o. female presents for follow-up of bilateral achilles tendonitis. Relates she missed some appointments and this was the earliest she could get back in. Relates no improvement with her pain. Relates the medrol dose pack helped for a day or two . Meloxicam helps a little but still gets a lot of pain. Has been stretching but not consistently    . Denies any other pedal complaints. Denies n/v/f/c.   Past Medical History:  Diagnosis Date   ADD (attention deficit disorder)    Bipolar 1 disorder (HCC)    Hiatal hernia    Obesity    Seasonal allergies     Objective:  Physical Exam: Vascular: DP/PT pulses 2/4 bilateral. CFT <3 seconds. Normal hair growth on digits. No edema.  Skin. No lacerations or abrasions bilateral feet.  Musculoskeletal: MMT 5/5 bilateral lower extremities in DF, PF, Inversion and Eversion. Deceased ROM in DF of ankle joint. Tender to posterior calcaneus in the area of the achilles insertion. Minimal pain along watershed area. No pain to medial calcaneal tubercle. No pain with calcaneal squeeze.  Neurological: Sensation intact to light touch.   Assessment:   1. Achilles tendinitis of both lower extremities      Plan:  Patient was evaluated and treated and all questions answered. -Xrays reviewed. No acute fractures or dislocations. Spurring noted to posterior calcaneus bilateral.  -Discussed Achilles insertional tendonitis and treatment options with patient.  -Continue heel lifts  -Continue stretchign . Amb ref to PT placed  -Continue melxoicam as needed.  -Discussed if no improvement will consider MRI/PT/EPAT/PRP injections.  -Patient to return to office in 8 weeks for recheck.    Louann Sjogren, DPM

## 2023-07-24 ENCOUNTER — Ambulatory Visit: Attending: Podiatry | Admitting: Physical Therapy

## 2023-08-13 ENCOUNTER — Ambulatory Visit: Attending: Podiatry

## 2023-08-13 ENCOUNTER — Other Ambulatory Visit: Payer: Self-pay

## 2023-08-13 DIAGNOSIS — R2681 Unsteadiness on feet: Secondary | ICD-10-CM | POA: Insufficient documentation

## 2023-08-13 DIAGNOSIS — M25571 Pain in right ankle and joints of right foot: Secondary | ICD-10-CM | POA: Diagnosis present

## 2023-08-13 DIAGNOSIS — M25572 Pain in left ankle and joints of left foot: Secondary | ICD-10-CM | POA: Diagnosis present

## 2023-08-13 DIAGNOSIS — M6281 Muscle weakness (generalized): Secondary | ICD-10-CM | POA: Insufficient documentation

## 2023-08-13 DIAGNOSIS — R2689 Other abnormalities of gait and mobility: Secondary | ICD-10-CM | POA: Diagnosis present

## 2023-08-13 NOTE — Therapy (Signed)
 OUTPATIENT PHYSICAL THERAPY LOWER EXTREMITY EVALUATION   Patient Name: Hayley White MRN: [id number] DOB:11-26-1970, 53 y.o., female Today's Date: 08/13/2023  END OF SESSION:  PT End of Session - 08/13/23 1144     Visit Number 1    Number of Visits 17    Date for PT Re-Evaluation 10/08/23    Authorization Type [employer]    PT Start Time 1100    PT Stop Time 1142    PT Time Calculation (min) 42 min    Activity Tolerance Patient tolerated treatment well    Behavior During Therapy WFL for tasks assessed/performed             Past Medical History:  Diagnosis Date   ADD (attention deficit disorder)    Bipolar 1 disorder (HCC)    Hiatal hernia    Obesity    Seasonal allergies    Past Surgical History:  Procedure Laterality Date   CHOLECYSTECTOMY     Patient Active Problem List   Diagnosis Date Noted   Hypercholesteremia 03/19/2023    PCP: Ollen Bowl, MD  REFERRING PROVIDER: Louann Sjogren, DPM  REFERRING DIAG: [id number] (ICD-10-CM) - Achilles tendinitis of both lower extremities   THERAPY DIAG:  Pain in right ankle and joints of right foot - Plan: PT plan of care cert/re-cert  Pain in left ankle and joints of left foot - Plan: PT plan of care cert/re-cert  Muscle weakness (generalized) - Plan: PT plan of care cert/re-cert  Unsteadiness on feet - Plan: PT plan of care cert/re-cert  Other abnormalities of gait and mobility - Plan: PT plan of care cert/re-cert  Rationale for Evaluation and Treatment: Rehabilitation  ONSET DATE: Chronic  SUBJECTIVE:   SUBJECTIVE STATEMENT: Pt presents to PT with reports of chronic bilateral heel pain which initially was R>L but now is about even. Denies trauma or MOI within recent months but notes that a couple of years ago she has had some trauma and chronicity to her R ankle. Denies N/T but promotes sharp and burning pain in bilateral heels. 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 Feels limited to 5 minutes for standing and walking.  PERTINENT HISTORY: See PMH  PAIN:  Are you having pain?  Yes: NPRS scale: 2/10 Worst: 8/10 Pain location: bilateral heels Pain description: "red hot knife that stabs" and tight Aggravating factors: mornings, walking, stairs, prolonged sitting Relieving factors: none  PRECAUTIONS: None  RED FLAGS: None   WEIGHT BEARING RESTRICTIONS: No  FALLS:  Has patient fallen in last 6 months? Yes. 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  DIAGNOSTIC FINDINGS: N/A  PATIENT SURVEYS:  LEFS: 22/80  COGNITION: Overall cognitive status: Within functional limits for tasks assessed     SENSATION: WFL  POSTURE: rounded shoulders, forward head, and increased lumbar lordosis - medial longitudinal arch collapse slightly in standing R>L  PALPATION: TTP to bilateral heel and medial posterior tibialis   LOWER EXTREMITY ROM:  Active ROM Right eval Left eval  Hip flexion    Hip extension    Hip abduction    Hip adduction    Hip internal rotation    Hip external rotation    Knee flexion    Knee extension    Ankle dorsiflexion 0 1  Ankle plantarflexion    Ankle inversion    Ankle eversion     (Blank rows = not tested)  LOWER EXTREMITY MMT:  MMT Right eval Left eval  Hip flexion    Hip extension    Hip abduction    Hip  adduction    Hip internal rotation    Hip external rotation    Knee flexion    Knee extension    Ankle dorsiflexion 5 5  Ankle plantarflexion 5 5  Ankle inversion 4 4  Ankle eversion 5 5   (Blank rows = not tested)  LOWER EXTREMITY SPECIAL TESTS:  DNT  FUNCTIONAL TESTS:  SLS: R - 2 seconds; L - 10 seconds  GAIT: Distance walked: 81ft Assistive device utilized: None Level of assistance: Complete Independence Comments: decreased gait speed, out toeing, decreased heel strike   TREATMENT: [facility] Adult PT Treatment:                                                DATE: 08/13/2023 Therapeutic Exercise: Longsitting calf stretch with towel x 30" each Longsitting ankle inversion x 10 each RTB Towel scrunch x 60" Seated heel raise with ball btwn heels x 5 - 5" hold  PATIENT EDUCATION:  Education details: eval findings, FOTO, HEP, POC Person educated: Patient Education method: Explanation, Demonstration, and Handouts Education comprehension: verbalized understanding and returned demonstration  HOME EXERCISE PROGRAM: Access Code: [email or web address] URL: [email or web address] Date: 08/13/2023 Prepared by: Edwinna Areola  Exercises - Long Sitting Calf Stretch with Strap  - 1-2 x daily - 7 x weekly - 2-3 sets - 30 sec hold - Towel Scrunches  - 1-2 x daily - 7 x weekly - 2-3 reps - 60" hold - Ankle Inversion with Resistance  - 1-2 x daily - 7 x weekly - 2 sets - 10 reps - red band hold - Seated Calf Raise With Small Ball at Heels  - 1-2 x daily - 7 x weekly - 2 sets - 10 reps - 5 sec hold  ASSESSMENT:  CLINICAL IMPRESSION: Patient is a 53 y.o. F who was seen today for physical therapy evaluation and treatment for chronic bilateral heel pain R>L. Physical findings are consistent with DPM impression as pt demonstrates palpable tenderness and decreased calf length. LEFS score shows severe disability which is a sharp decrease in subjective functional ability below PLOF. Pt would benefit  from skilled PT services for multimodal approach towards decreasing pain and improving function.    OBJECTIVE IMPAIRMENTS: Abnormal gait, decreased activity tolerance, decreased balance, decreased mobility, difficulty walking, decreased ROM, decreased strength, and pain  ACTIVITY LIMITATIONS: carrying, lifting, sitting, standing, squatting, stairs, transfers, locomotion level, and caring for others  PARTICIPATION LIMITATIONS: meal prep, cleaning, driving, shopping, community activity, occupation, and yard work  PERSONAL FACTORS: Fitness and Time since onset of injury/illness/exacerbation are also affecting patient's functional outcome.   REHAB POTENTIAL: Good  CLINICAL DECISION MAKING: Evolving/moderate complexity  EVALUATION COMPLEXITY: Moderate  GOALS: Goals reviewed with patient? No  SHORT TERM GOALS: Target date: 09/03/2023   Pt will be compliant and knowledgeable with initial HEP for improved comfort and carryover Baseline: initial HEP given  Goal status: INITIAL  2.  Pt will self report bilateral ankle/foot pain no greater than 6/10 for improved comfort and functional ability Baseline: 8/10 at worst Goal status: INITIAL   LONG TERM GOALS: Target date: 10/08/2023   Pt will improve LEFS to no less than 35/80 as proxy for functional improvement with home ADLs and higher level community activity Baseline: 22/80 Goal status: INITIAL   2.  Pt will self report bilateral ankle/foot pain no greater than 3/10 for improved comfort and functional ability Baseline: 8/10 at worst Goal status: INITIAL   3.  Pt will improve bilateral ankle DF AROM to no less than 8 degrees for improved functional mobility and decreased foot/ankle pain Baseline: see ROM chart Goal status: INITIAL  4.  Pt will improve bilateral SLS hold time to no less than 30 seconds as evidence of improved balance and bilateral ankle stability Baseline: R - 2 seconds; L - 10 seconds Goal status: INITIAL  5.  Pt  will improve bilateral ankle inversion MMT to no less than 5/5 with decreased pain for improved arch stability and decrease in pain Baseline: 4/5 Goal status: INITIAL  6.  Pt will improve standing activity tolerance to at least 20-30 minutes for improved comfort/tolerance with nanny/caregiving duties Baseline: 5 minutes Goal status: INITIAL   PLAN:  PT FREQUENCY: 1-2x/week  PT DURATION: 8 weeks  PLANNED INTERVENTIONS: 97164- PT Re-evaluation, 97110-Therapeutic exercises, 97530- Therapeutic activity, [id number]- Neuromuscular re-education, 97535- Self Care, 16109- Manual therapy, [id number]- Gait training, [phone]- Aquatic Therapy, U9811- Electrical stimulation (unattended), [id number]- Electrical stimulation (manual), 97016- Vasopneumatic device, Dry Needling, Cryotherapy, and Moist heat  PLAN FOR NEXT SESSION: assess HEP response, progress calf length and ankle strengthening, balance and heel strike with gait   Eloy End, PT 08/13/2023, 11:57 AM

## 2023-08-22 ENCOUNTER — Ambulatory Visit: Admitting: Physical Therapy

## 2023-08-27 ENCOUNTER — Ambulatory Visit

## 2023-08-27 DIAGNOSIS — M25571 Pain in right ankle and joints of right foot: Secondary | ICD-10-CM | POA: Diagnosis not present

## 2023-08-27 DIAGNOSIS — M25572 Pain in left ankle and joints of left foot: Secondary | ICD-10-CM

## 2023-08-27 DIAGNOSIS — M6281 Muscle weakness (generalized): Secondary | ICD-10-CM

## 2023-08-27 NOTE — Therapy (Signed)
 OUTPATIENT PHYSICAL THERAPY TREATMENT   Patient Name: Hayley White MRN: [id number] DOB:[birth year], 53 y.o., female Today's Date: 08/27/2023  END OF SESSION:  PT End of Session - 08/27/23 1402     Visit Number 2    Number of Visits 17    Date for PT Re-Evaluation 10/08/23    Authorization Type [employer]    PT Start Time 1401    PT Stop Time 1441    PT Time Calculation (min) 40 min    Activity Tolerance Patient tolerated treatment well    Behavior During Therapy WFL for tasks assessed/performed              Past Medical History:  Diagnosis Date   ADD (attention deficit disorder)    Bipolar 1 disorder (HCC)    Hiatal hernia    Obesity    Seasonal allergies    Past Surgical History:  Procedure Laterality Date   CHOLECYSTECTOMY     Patient Active Problem List   Diagnosis Date Noted   Hypercholesteremia 03/19/2023    PCP: Elester Grim, MD  REFERRING PROVIDER: Jennefer Moats, DPM  REFERRING DIAG: [phone] (ICD-10-CM) - Achilles tendinitis of both lower extremities   THERAPY DIAG:  Pain in right ankle and joints of right foot  Pain in left ankle and joints of left foot  Muscle weakness (generalized)  Rationale for Evaluation and Treatment: Rehabilitation  ONSET DATE: Chronic  SUBJECTIVE:   SUBJECTIVE STATEMENT: Pt presents to PT with reports of continued bilateral heel pain. Has been somewhat compliant with HEP.   EVAL: Pt presents to PT with reports of chronic bilateral heel pain which initially was R>L but now is about even. Denies trauma or MOI within recent months but notes that a couple of years ago she has had some trauma and chronicity to her R ankle. Denies N/T but promotes sharp and burning pain in bilateral heels. 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 Feels limited to 5 minutes for standing and walking.  PERTINENT HISTORY: See PMH  PAIN:  Are  you having pain?  Yes: NPRS scale: 2/10 Worst: 8/10 Pain location: bilateral heels Pain description: "red hot knife that stabs" and tight Aggravating factors: mornings, walking, stairs, prolonged sitting Relieving factors: none  PRECAUTIONS: None  RED FLAGS: None   WEIGHT BEARING RESTRICTIONS: No  FALLS:  Has patient fallen in last 6 months? Yes. 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  DIAGNOSTIC FINDINGS: N/A  PATIENT SURVEYS:  LEFS: 22/80  COGNITION: Overall cognitive status: Within functional limits for tasks assessed     SENSATION: WFL  POSTURE: rounded shoulders, forward head, and increased lumbar lordosis - medial longitudinal arch collapse slightly in standing R>L  PALPATION: TTP to bilateral heel and medial posterior tibialis   LOWER EXTREMITY ROM:  Active ROM Right eval Left eval  Hip flexion  Hip extension    Hip abduction    Hip adduction    Hip internal rotation    Hip external rotation    Knee flexion    Knee extension    Ankle dorsiflexion 0 1  Ankle plantarflexion    Ankle inversion    Ankle eversion     (Blank rows = not tested)  LOWER EXTREMITY MMT:  MMT Right eval Left eval  Hip flexion    Hip extension    Hip abduction    Hip adduction    Hip internal rotation    Hip external rotation    Knee flexion    Knee extension    Ankle dorsiflexion 5 5  Ankle plantarflexion 5 5  Ankle  inversion 4 4  Ankle eversion 5 5   (Blank rows = not tested)  LOWER EXTREMITY SPECIAL TESTS:  DNT  FUNCTIONAL TESTS:  SLS: R - 2 seconds; L - 10 seconds  GAIT: Distance walked: 37ft Assistive device utilized: None Level of assistance: Complete Independence Comments: decreased gait speed, out toeing, decreased heel strike   TREATMENT: [facility] Adult PT Treatment:                                                DATE: 08/27/2023 Therapeutic Exercise: NuStep lvl 4 UE/LE x 3 min for functional activity tolerance Slant board gastroc stretch 2x30" Slant board soleus stretch 2x30"  Eccentric heel lowering on 2in 2x10 Long sitting calf stretch with strap 2x30" each Longsitting ankle DF/inv/ev 2x10 RTB Manual Therapy: STM to bilateral achilles IASTM to bilateral plantar fasica  [facility] Adult PT Treatment:                                                DATE: 08/13/2023 Therapeutic Exercise: Longsitting calf stretch with towel x 30" each Longsitting ankle inversion x 10 each RTB Towel scrunch x 60" Seated heel raise with ball btwn heels x 5 - 5" hold  PATIENT EDUCATION:  Education details: eval findings, FOTO, HEP, POC Person educated: Patient Education method: Explanation, Demonstration, and Handouts Education comprehension: verbalized understanding and returned demonstration  HOME EXERCISE PROGRAM: Access Code: [email or web address] URL: [email or web address] Date: 08/13/2023 Prepared by: Loral Roch  Exercises - Long Sitting Calf Stretch with Strap  - 1-2 x daily - 7 x weekly - 2-3 sets - 30 sec hold - Towel Scrunches  - 1-2 x daily - 7 x weekly - 2-3 reps - 60" hold - Ankle Inversion with Resistance  - 1-2 x daily - 7 x weekly - 2 sets - 10 reps - red band hold - Seated Calf Raise With Small Ball at Heels  - 1-2 x daily - 7 x weekly - 2 sets - 10 reps - 5 sec hold  ASSESSMENT:  CLINICAL IMPRESSION: Pt was able to complete all prescribed exercises with no adverse effect.  Exercises focused on improving ankle strength and calf length and manual therapy for decreasing pain. Responded well to STM and IASTM with decreased pain noted post session. Progressing with therapy, will continue per POC.   EVAL: Patient is a 53 y.o. F who was seen today for physical therapy evaluation and treatment for chronic bilateral heel pain R>L. Physical findings are  consistent with DPM impression as pt demonstrates palpable tenderness and decreased calf length. LEFS score shows severe disability which is a sharp decrease in subjective functional ability below PLOF. Pt would benefit from skilled PT services for multimodal approach towards decreasing pain and improving function.    OBJECTIVE IMPAIRMENTS: Abnormal gait, decreased activity tolerance, decreased balance, decreased mobility, difficulty walking, decreased ROM, decreased strength, and pain  ACTIVITY LIMITATIONS: carrying, lifting, sitting, standing, squatting, stairs, transfers, locomotion level, and caring for others  PARTICIPATION LIMITATIONS: meal prep, cleaning, driving, shopping, community activity, occupation, and yard work  PERSONAL FACTORS: Fitness and Time since onset of injury/illness/exacerbation are also affecting patient's functional outcome.   REHAB POTENTIAL: Good  CLINICAL DECISION MAKING: Evolving/moderate complexity  EVALUATION COMPLEXITY: Moderate   GOALS: Goals reviewed with patient? No  SHORT TERM GOALS: Target date: 09/03/2023   Pt will be compliant and knowledgeable with initial HEP for improved comfort and carryover Baseline: initial HEP given  Goal status: INITIAL  2.  Pt will self report bilateral ankle/foot pain no greater than 6/10 for improved comfort and functional ability Baseline: 8/10 at worst Goal status: INITIAL   LONG TERM GOALS: Target date: 10/08/2023   Pt will improve LEFS to no less than 35/80 as proxy for functional improvement with home ADLs and higher level community  activity Baseline: 22/80 Goal status: INITIAL   2.  Pt will self report bilateral ankle/foot pain no greater than 3/10 for improved comfort and functional ability Baseline: 8/10 at worst Goal status: INITIAL   3.  Pt will improve bilateral ankle DF AROM to no less than 8 degrees for improved functional mobility and decreased foot/ankle pain Baseline: see ROM chart Goal status: INITIAL  4.  Pt will improve bilateral SLS hold time to no less than 30 seconds as evidence of improved balance and bilateral ankle stability Baseline: R - 2 seconds; L - 10 seconds Goal status: INITIAL  5.  Pt will improve bilateral ankle inversion MMT to no less than 5/5 with decreased pain for improved arch stability and decrease in pain Baseline: 4/5 Goal status: INITIAL  6.  Pt will improve standing activity tolerance to at least 20-30 minutes for improved comfort/tolerance with nanny/caregiving duties Baseline: 5 minutes Goal status: INITIAL   PLAN:  PT FREQUENCY: 1-2x/week  PT DURATION: 8 weeks  PLANNED INTERVENTIONS: 97164- PT Re-evaluation, 97110-Therapeutic exercises, 97530- Therapeutic activity, [id number]- Neuromuscular re-education, 97535- Self Care, 81191- Manual therapy, [id number]- Gait training, [id number]- Aquatic Therapy, [id number]- Electrical stimulation (unattended), [id number]- Electrical stimulation (manual), 97016- Vasopneumatic device, Dry Needling, Cryotherapy, and Moist heat  PLAN FOR NEXT SESSION: assess HEP response, progress calf length and ankle strengthening, balance and heel strike with gait   Ivor Mars, PT 08/27/2023, 3:12 PM

## 2023-08-30 ENCOUNTER — Other Ambulatory Visit: Payer: Self-pay

## 2023-08-30 ENCOUNTER — Emergency Department (HOSPITAL_COMMUNITY)

## 2023-08-30 ENCOUNTER — Emergency Department (HOSPITAL_COMMUNITY)
Admission: EM | Admit: 2023-08-30 | Discharge: 2023-08-30 | Disposition: A | Discharge status: Home / Self Care | Attending: Emergency Medicine | Admitting: Emergency Medicine

## 2023-08-30 DIAGNOSIS — M79661 Pain in right lower leg: Secondary | ICD-10-CM | POA: Diagnosis present

## 2023-08-30 DIAGNOSIS — T148XXA Other injury of unspecified body region, initial encounter: Secondary | ICD-10-CM

## 2023-08-30 DIAGNOSIS — M79604 Pain in right leg: Secondary | ICD-10-CM

## 2023-08-30 MED ORDER — NAPROXEN 500 MG PO TABS: 500.0000 mg | ORAL_TABLET | Freq: Two times a day (BID) | ORAL | 0 refills | Status: AC

## 2023-08-30 MED ORDER — NAPROXEN 500 MG PO TABS: 500.0000 mg | ORAL_TABLET | Freq: Two times a day (BID) | ORAL | 0 refills | Status: DC

## 2023-08-30 NOTE — Discharge Instructions (Addendum)
-   Take naproxen  500 mg twice daily for 7-14 days for muscle strain, you can also use Tylenol  in addition to naproxen .  Do not take ibuprofen or other NSAIDs. -Continue physical therapy, I recommend you call your PCP or podiatrist particularly if pain is not improving -Return to ED if your right extremity becomes significantly swollen, you cannot walk due to pain, or if you develop shortness of breath

## 2023-08-30 NOTE — Progress Notes (Signed)
 Lower extremity venous duplex completed. Please see CV Procedures for preliminary results.  Estanislao Heimlich, RVT 08/30/23 12:59 PM

## 2023-08-30 NOTE — ED Triage Notes (Signed)
 Pt c/o pain in R calf for 1x day. Pt states they feel some swelling. Pain worse when walking.

## 2023-08-30 NOTE — ED Provider Notes (Signed)
 [facility] Provider Note   CSN: [id number] Arrival date & time: 08/30/23  1014     History  Chief Complaint  Patient presents with   Leg Pain     Hayley White is a 53 y.o. female currently seeing physical therapy for bilateral Achilles tendinitis, presenting with 24 hours of right calf pain.  Pain started yesterday after PT.  States she feels her right leg is more swollen.  Pain is located in posterior knee and medial calf. Pain is exacerbated with walking.  Improved with rest.  Vapes, but does not smoke tobacco.  No use of estrogen.  No history of blood clots, no family history of blood clots.  No personal history of cancer.  No recent immobilization.  No trauma, no recent surgeries.  Denies chest pain, shortness of breath, exertional dyspnea, cough, hemoptysis.   Leg Pain      Home Medications Prior to Admission medications   Medication Sig Start Date End Date Taking? Authorizing Provider  ALPRAZolam (XANAX) 0.5 MG tablet Take 0.5 mg by mouth 3 (three) times daily as needed.    [provider]  atomoxetine (STRATTERA) 100 MG capsule Take 100 mg by mouth daily.    [provider]  buPROPion (WELLBUTRIN SR) 200 MG 12 hr tablet Take 200 mg by mouth 2 (two) times daily. Take two times a day  Per patient    [provider]  cephALEXin  (KEFLEX ) 500 MG capsule Take 1 capsule (500 mg total) by mouth 4 (four) times daily. 12/25/21   Lawsing, James, MD  cloNIDine (CATAPRES) 0.2 MG tablet Take 0.2 mg by mouth at bedtime.     [provider]  CRANBERRY PO Take 8 tablets by mouth daily. Take 8 tablets every day per patient    [provider]  diphenhydrAMINE  (BENADRYL ) 25 mg capsule Take 25 mg by mouth at bedtime. Take every day per patient    [provider]  doxycycline  (VIBRAMYCIN ) 100 MG capsule Take 1 capsule (100 mg total) by mouth 2 (two) times daily. 12/25/21   Rosealee Concha,  MD  fluconazole  (DIFLUCAN ) 150 MG tablet Take 1 tablet (150 mg total) by mouth once. 10/10/14   Muthersbaugh, Alisa App, PA-C  HYDROcodone -acetaminophen  (NORCO/VICODIN) 5-325 MG per tablet Take 1-2 tablets by mouth every 6 (six) hours as needed for moderate pain or severe pain. 10/10/14   Muthersbaugh, Alisa App, PA-C  Ibuprofen 200 MG CAPS Take 1 capsule by mouth daily.    [provider]  lamoTRIgine (LAMICTAL) 150 MG tablet Take 150 mg by mouth daily.    [provider]  loratadine (CLARITIN) 10 MG tablet Take 10 mg by mouth daily.    [provider]  meloxicam (MOBIC) 15 MG tablet Take 1 tablet by mouth daily as needed.    [provider]  methylPREDNISolone  (MEDROL  DOSEPAK) 4 MG TBPK tablet Take as directed 03/19/23   Sikora, Rebecca, DPM  omeprazole (PRILOSEC) 20 MG capsule Take 20 mg by mouth daily.    [provider]  ondansetron  (ZOFRAN  ODT) 4 MG disintegrating tablet 4mg  ODT q4 hours prn nausea/vomit 10/10/14   Muthersbaugh, Alisa App, PA-C  predniSONE  (STERAPRED UNI-PAK 21 TAB) 10 MG (21) TBPK tablet Take by mouth daily. Take 6 tabs by mouth daily  for 2 days, then 5 tabs for 2 days, then 4 tabs for 2 days, then 3 tabs for 2 days, 2 tabs for 2 days, then 1 tab by mouth daily for 2  days 06/23/18   Velna Ghee, MD  pseudoephedrine (SUDAFED) 120 MG 12 hr tablet Take 120 mg by mouth daily.    [provider]  traZODone (DESYREL) 50 MG tablet Take 50 mg by mouth at bedtime.    [provider]      Allergies    Amoxicillin-pot clavulanate and Sulfamethoxazole-trimethoprim    Review of Systems   Review of Systems  Respiratory:  Negative for apnea, cough, chest tightness and shortness of breath.   Cardiovascular:  Positive for leg swelling. Negative for chest pain and palpitations.  Musculoskeletal:  Myalgias: Right calf pain.  Skin:  Negative for rash.      Physical Exam Updated Vital Signs BP (!) 147/87 (BP Location: Right Arm)    Pulse 98   Temp 98.6 F (37 C) (Oral)   Resp 15   Ht 5\' 4"  (1.626 m)   Wt 136.1 kg   SpO2 95%   BMI 51.49 kg/m  Physical Exam Constitutional:      Appearance: Normal appearance.  HENT:     Head: Normocephalic and atraumatic.  Eyes:     Extraocular Movements: Extraocular movements intact.     Conjunctiva/sclera: Conjunctivae normal.     Pupils: Pupils are equal, round, and reactive to light.  Cardiovascular:     Rate and Rhythm: Normal rate and regular rhythm.  Pulmonary:     Effort: Pulmonary effort is normal.     Breath sounds: Normal breath sounds.  Musculoskeletal:     Cervical back: Normal range of motion.     Comments: Right knee/calf/foot: No gross deformity, no ecchymosis, no swelling appreciated (limited by body habitus).  TTP in popliteal fossa, and medial aspect of calf.  Pain with dorsiflexion of right foot.  Neurovascularly intact foot.  5/5 strength right lower extremity and foot.  Neurological:     Mental Status: She is alert.     ED Results / Procedures / Treatments   Labs (all labs ordered are listed, but only abnormal results are displayed) Labs Reviewed - No data to display  EKG None  Radiology No results found.  Procedures Procedures    Medications Ordered in ED Medications - No data to display  ED Course/ Medical Decision Making/ A&P                                 Medical Decision Making 53 year old female with past minus) for bilateral Achilles tendinitis presenting with right calf pain for 24 hours.  Differential is broad includes: DVT, tendinitis, muscle strain.  Low concern for fracture, no trauma/weightbearing.  Low concern for compartment syndrome (pulses present, normal sensation, full strength).  Well score 1.  Will obtain venous Doppler right lower extremity to rule out DVT.  Reviewed pulmonary results right lower extremity Doppler: negative for DVT.  Went to reevaluate patient, her pain is improved.  With negative Doppler,  higher suspicion that this is MSK related (gastroc/soleus strain) particularly with starting PT for Achilles tendinitis and point tenderness over origin of gastrocnemius.  Will prescribe naproxen  for pain, recommend continued PT, follow-up with her PCP or podiatrist.          Final Clinical Impression(s) / ED Diagnoses Final diagnoses:  None    Rx / DC Orders ED Discharge Orders     None         Lavada Porteous, DO 08/30/23 1452    Deatra Face, MD 09/01/23 [phone]

## 2023-09-03 ENCOUNTER — Ambulatory Visit

## 2023-09-03 ENCOUNTER — Telehealth: Payer: Self-pay

## 2023-09-10 ENCOUNTER — Encounter: Payer: Self-pay | Admitting: Physical Therapy

## 2023-09-10 ENCOUNTER — Ambulatory Visit

## 2023-09-10 ENCOUNTER — Ambulatory Visit: Attending: Podiatry | Admitting: Physical Therapy

## 2023-09-10 DIAGNOSIS — M25571 Pain in right ankle and joints of right foot: Secondary | ICD-10-CM | POA: Diagnosis present

## 2023-09-10 DIAGNOSIS — M25572 Pain in left ankle and joints of left foot: Secondary | ICD-10-CM | POA: Insufficient documentation

## 2023-09-10 NOTE — Therapy (Addendum)
 OUTPATIENT PHYSICAL THERAPY TREATMENT NOTE/DISCHARGE  PHYSICAL THERAPY DISCHARGE SUMMARY  Visits from Start of Care: 3  Current functional level related to goals / functional outcomes: See goals/objective   Remaining deficits: Unable to assess   Education / Equipment: HEP   Patient agrees to discharge. Patient goals were unable to assess. Patient is being discharged due to not returning since the last visit.    Patient Name: Hayley White MRN: [id number] DOB:1970/11/20, 53 y.o., female Today's Date: 09/10/2023  END OF SESSION:  PT End of Session - 09/10/23 1412     Visit Number 3    Number of Visits 17    Date for PT Re-Evaluation 10/08/23    Authorization Type [employer]    PT Start Time 0210   10 min late check in   PT Stop Time 0240    PT Time Calculation (min) 30 min              Past Medical History:  Diagnosis Date   ADD (attention deficit disorder)    Bipolar 1 disorder (HCC)    Hiatal hernia    Obesity    Seasonal allergies    Past Surgical History:  Procedure Laterality Date   CHOLECYSTECTOMY     Patient Active Problem List   Diagnosis Date Noted   Hypercholesteremia 03/19/2023    PCP: Vernon Velna SAUNDERS, MD  REFERRING PROVIDER: Joya Stabs, DPM  REFERRING DIAG: [phone] (ICD-10-CM) - Achilles tendinitis of both lower extremities   THERAPY DIAG:  Pain in right ankle and joints of right foot  Pain in left ankle and joints of left foot  Rationale for Evaluation and Treatment: Rehabilitation  ONSET DATE: Chronic  SUBJECTIVE:   SUBJECTIVE STATEMENT: Pt reports the last 2 days her pain has been less. She is somewhat compliant with HEP. Did have a sharp pain in calf 3 days after last session and went to ED to rule out blood clot.    Pt presents to PT with reports of continued bilateral heel pain. Has been somewhat compliant with HEP.   EVAL: Pt presents to PT with reports of chronic bilateral heel pain which initially was  R>L but now is about even. Denies trauma or MOI within recent months but notes that a couple of years ago she has had some trauma and chronicity to her R ankle. Denies N/T but promotes sharp and burning pain in bilateral heels. 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 Feels limited to 5 minutes for standing and walking.  PERTINENT HISTORY: See PMH  PAIN:  Are you having pain?  Yes: NPRS scale: 3 /10 with walking  Worst: 8/10 Pain location: bilateral heels (backs of heels) Pain description: red hot knife that stabs and tight Aggravating factors: mornings, walking, stairs, prolonged sitting Relieving factors: none  PRECAUTIONS: None  RED FLAGS: None   WEIGHT BEARING RESTRICTIONS: No  FALLS:  Has patient fallen in last 6 months? Yes. 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  DIAGNOSTIC FINDINGS: N/A  PATIENT SURVEYS:  LEFS: 22/80  COGNITION: Overall cognitive status: Within functional limits for tasks assessed     SENSATION: WFL  POSTURE: rounded shoulders, forward head, and increased lumbar lordosis - medial longitudinal arch collapse slightly in standing R>L  PALPATION: TTP to bilateral heel and medial posterior tibialis   LOWER EXTREMITY ROM:  Active ROM Right eval Left eval Right 09/10/23 Left 09/10/23  Hip flexion      Hip extension       Hip abduction      Hip adduction      Hip internal rotation      Hip external rotation      Knee flexion      Knee extension      Ankle dorsiflexion 0 1 8 3   Ankle plantarflexion      Ankle inversion      Ankle eversion       (Blank rows = not tested)  LOWER EXTREMITY MMT:  MMT Right eval Left eval R/L 09/10/23   Hip flexion     Hip extension     Hip abduction     Hip adduction     Hip internal rotation     Hip external rotation     Knee flexion     Knee extension     Ankle dorsiflexion 5 5   Ankle plantarflexion 5 5   Ankle inversion 4 4 5    /  5  Ankle eversion 5 5    (Blank rows = not tested)  LOWER EXTREMITY SPECIAL TESTS:  DNT  FUNCTIONAL TESTS:  SLS: R - 2 seconds; L - 10 seconds-  09/10/23- no change in SLS 09/10/23: Tandem stance > 30 sec each foot back  GAIT: Distance walked: 16ft Assistive device utilized: None Level of assistance: Complete Independence Comments: decreased gait speed, out toeing, decreased heel strike   TREATMENT: [facility] Adult PT Treatment:                                                DATE: 09/10/23 Therapeutic Exercise: Slant board stretch gastroc and soleus x 2 each Runners stretch- unable to hold  Step stretch for gastroc with UE support , single leg x 20 sec each- pt reports increased heel pain so disc  Neuromuscular re-ed: SLS trials Tandem stance trials  Therapeutic Activity: Ankle MMT inv/Ev Ankle AROM DF     [facility] Adult PT Treatment:                                                DATE: 08/27/2023 Therapeutic Exercise: NuStep lvl 4 UE/LE x 3 min for functional activity tolerance Slant board gastroc stretch 2x30 Slant board soleus stretch 2x30  Eccentric heel lowering on 2in 2x10 Long sitting calf stretch with strap 2x30 each Longsitting ankle DF/inv/ev 2x10 RTB Manual Therapy: STM to bilateral achilles IASTM to bilateral plantar fasica  [facility] Adult PT Treatment:  DATE: 08/13/2023 Therapeutic Exercise: Longsitting calf stretch with towel x 30 each Longsitting ankle inversion x 10 each RTB Towel scrunch x 60 Seated heel raise with ball btwn heels x 5 - 5 hold  PATIENT EDUCATION:  Education details: eval findings, FOTO, HEP, POC Person educated: Patient Education method: Explanation, Demonstration, and Handouts Education comprehension: verbalized understanding and returned demonstration  HOME EXERCISE PROGRAM: Access Code: [email or web address] URL: [email or web address] Date: 08/13/2023 Prepared by: Alm Kingdom  Exercises - Long Sitting Calf Stretch with Strap  - 1-2 x daily - 7 x weekly - 2-3 sets - 30 sec hold - Towel Scrunches  - 1-2 x daily - 7 x weekly - 2-3 reps - 60 hold - Ankle Inversion with Resistance  - 1-2 x daily - 7 x weekly - 2 sets - 10 reps - red band hold - Seated Calf Raise With Small Ball at Heels  - 1-2 x daily - 7 x weekly - 2 sets - 10 reps - 5 sec hold  ASSESSMENT:  CLINICAL IMPRESSION: Pt was able to complete all prescribed exercises with no adverse effect. She reports decreased heel pain today and for the last couple of days. Had to cancel her appt last week and has been somewhat compliant with HEP. She reports compliance with edge of step heel raises however is not performing any other closed chain calf stretches. She was unable to tolerate runners stretch due to knee pains. Instructed her in single leg calf stretch at edge of step however she reported increased pain. Her SLS was found to be unchanged but she could hold tandem stance for >30 sec. Added Tandem stance to HEP. Her ankle strength for inversion is 5/5 meeting LTG#5.  Her activity tolerance is slightly improved since eval. She is making progress toward remaining goals.   Progressing with therapy, will continue per POC. Pt will see MD on Monday and wants to wait until afterward to schedule more appts.   EVAL: Patient is a 53 y.o. F who was seen today for  physical therapy evaluation and treatment for chronic bilateral heel pain R>L. Physical findings are consistent with DPM impression as pt demonstrates palpable tenderness and decreased calf length. LEFS score shows severe disability which is a sharp decrease in subjective functional ability below PLOF. Pt would benefit from skilled PT services for multimodal approach towards decreasing pain and improving function.    OBJECTIVE IMPAIRMENTS: Abnormal gait, decreased activity tolerance, decreased balance, decreased mobility, difficulty walking, decreased ROM, decreased strength, and pain  ACTIVITY LIMITATIONS: carrying, lifting, sitting, standing, squatting, stairs, transfers, locomotion level, and caring for others  PARTICIPATION LIMITATIONS: meal prep, cleaning, driving, shopping, community activity, occupation, and yard work  PERSONAL FACTORS: Fitness and Time since onset of injury/illness/exacerbation are also affecting patient's functional outcome.   REHAB POTENTIAL: Good  CLINICAL DECISION MAKING: Evolving/moderate complexity  EVALUATION COMPLEXITY: Moderate   GOALS: Goals reviewed with patient? No  SHORT TERM GOALS: Target date: 09/03/2023   Pt will be compliant and knowledgeable with initial HEP for improved comfort and carryover Baseline: initial HEP given  09/10/23: not as much as I shoulder Goal status: ONGOING  2.  Pt will self report bilateral ankle/foot pain no greater than 6/10 for improved comfort and functional ability Baseline: 8/10 at worst 09/10/23: 6/10 or less last couple of days Goal status: ONGOING   LONG TERM GOALS: Target date: 10/08/2023   Pt will improve LEFS to no less than 35/80 as proxy for functional improvement with home ADLs and higher  level community activity Baseline: 22/80 Goal status: INITIAL   2.  Pt will self report bilateral ankle/foot pain no greater than 3/10 for improved comfort and functional ability Baseline: 8/10 at worst Goal status:  INITIAL   3.  Pt will improve bilateral ankle DF AROM to no less than 8 degrees for improved functional mobility and decreased foot/ankle pain Baseline: see ROM chart 09/10/23: Right 8 Goal status: PARTIALLY MET  4.  Pt will improve bilateral SLS hold time to no less than 30 seconds as evidence of improved balance and bilateral ankle stability Baseline: R - 2 seconds; L - 10 seconds 09/10/23: no change  Goal status: ONGOING  5.  Pt will improve bilateral ankle inversion MMT to no less than 5/5 with decreased pain for improved arch stability and decrease in pain Baseline: 4/5 09/10/23: 5/5 Goal status: MET  6.  Pt will improve standing activity tolerance to at least 20-30 minutes for improved comfort/tolerance with nanny/caregiving duties Baseline: 5 minutes 09/10/23: 5-10 standing to check in without difficulty Goal status: ONGOING   PLAN:  PT FREQUENCY: 1-2x/week  PT DURATION: 8 weeks  PLANNED INTERVENTIONS: 97164- PT Re-evaluation, 97110-Therapeutic exercises, 97530- Therapeutic activity, 97112- Neuromuscular re-education, 97535- Self Care, [id number]- Manual therapy, [id number]- Gait training, [phone]- Aquatic Therapy, [phone]- Electrical stimulation (unattended), [id number]- Electrical stimulation (manual), 97016- Vasopneumatic device, Dry Needling, Cryotherapy, and Moist heat  PLAN FOR NEXT SESSION: assess HEP response, progress calf length and ankle strengthening, balance and heel strike with gait   Harlene Persons, PTA 09/10/23 3:15 PM Phone: [phone] Fax: [phone]

## 2023-09-17 ENCOUNTER — Ambulatory Visit (INDEPENDENT_AMBULATORY_CARE_PROVIDER_SITE_OTHER): Admitting: Podiatry

## 2023-09-17 ENCOUNTER — Encounter: Payer: Self-pay | Admitting: Podiatry

## 2023-09-17 DIAGNOSIS — M7662 Achilles tendinitis, left leg: Secondary | ICD-10-CM | POA: Diagnosis not present

## 2023-09-17 DIAGNOSIS — M7661 Achilles tendinitis, right leg: Secondary | ICD-10-CM | POA: Diagnosis not present

## 2023-09-17 NOTE — Progress Notes (Signed)
  Subjective:  Patient ID: Hayley White, female    DOB: [birth year],   MRN: [id number]  No chief complaint on file.   53 y.o. female presents for follow-up of bilateral achilles tendonitis. Relates PT has helped and about 25% better. Still worried about not having pain for her wedding.     . Denies any other pedal complaints. Denies n/v/f/c.   Past Medical History:  Diagnosis Date   ADD (attention deficit disorder)    Bipolar 1 disorder (HCC)    Hiatal hernia    Obesity    Seasonal allergies     Objective:  Physical Exam: Vascular: DP/PT pulses 2/4 bilateral. CFT <3 seconds. Normal hair growth on digits. No edema.  Skin. No lacerations or abrasions bilateral feet.  Musculoskeletal: MMT 5/5 bilateral lower extremities in DF, PF, Inversion and Eversion. Deceased ROM in DF of ankle joint. Tender to posterior calcaneus in the area of the achilles insertion. Minimal pain along watershed area. No pain to medial calcaneal tubercle. No pain with calcaneal squeeze.  Neurological: Sensation intact to light touch.   Assessment:   1. Achilles tendinitis of both lower extremities      Plan:  Patient was evaluated and treated and all questions answered. -Xrays reviewed. No acute fractures or dislocations. Spurring noted to posterior calcaneus bilateral.  -Discussed Achilles insertional tendonitis and treatment options with patient.  -Continue PT.  Will try for PRP injection.  -Discussed if no improvement will consider MRI/EPAT/ Patient to return to office  for PRP injection.    Jennefer Moats, DPM

## 2023-10-08 ENCOUNTER — Ambulatory Visit

## 2023-10-08 NOTE — Therapy (Incomplete)
 OUTPATIENT PHYSICAL THERAPY TREATMENT   Patient Name: Marvine Encalade MRN: [id number] DOB:05/26/1970, 53 y.o., female Today's Date: 10/08/2023  END OF SESSION:     Past Medical History:  Diagnosis Date   ADD (attention deficit disorder)    Bipolar 1 disorder (HCC)    Hiatal hernia    Obesity    Seasonal allergies    Past Surgical History:  Procedure Laterality Date   CHOLECYSTECTOMY     Patient Active Problem List   Diagnosis Date Noted   Hypercholesteremia 03/19/2023    PCP: Elester Grim, MD  REFERRING PROVIDER: Jennefer Moats, DPM  REFERRING DIAG: [phone] (ICD-10-CM) - Achilles tendinitis of both lower extremities   THERAPY DIAG:  No diagnosis found.  Rationale for Evaluation and Treatment: Rehabilitation  ONSET DATE: Chronic  SUBJECTIVE:   SUBJECTIVE STATEMENT: ***  EVAL: Pt presents to PT with reports of chronic bilateral heel pain which initially was R>L but now is about even. Denies trauma or MOI within recent months but notes that a couple of years ago she has had some trauma and chronicity to her R ankle. Denies N/T but promotes sharp and burning pain in bilateral heels. 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 Feels limited to 5 minutes for standing and walking.  PERTINENT HISTORY: See PMH  PAIN:  Are you having pain?  Yes: NPRS scale: 3 /10 with walking  Worst: 8/10 Pain location: bilateral heels (backs of heels) Pain description: "red hot knife that stabs" and tight Aggravating factors: mornings, walking, stairs, prolonged sitting Relieving factors: none  PRECAUTIONS: None  RED FLAGS: None   WEIGHT BEARING RESTRICTIONS: No  FALLS:  Has patient fallen in last 6 months? Yes. 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  DIAGNOSTIC FINDINGS: N/A  PATIENT SURVEYS:  LEFS: 22/80  COGNITION: Overall cognitive status: Within functional limits for tasks assessed     SENSATION: WFL  POSTURE: rounded shoulders, forward head, and increased lumbar lordosis - medial longitudinal arch collapse slightly in standing R>L  PALPATION: TTP to bilateral heel and medial posterior tibialis   LOWER EXTREMITY ROM:  Active ROM Right eval Left eval Right 09/10/23 Left 09/10/23  Hip flexion      Hip extension      Hip abduction      Hip adduction      Hip internal rotation      Hip external rotation      Knee flexion      Knee extension      Ankle dorsiflexion 0 1 8 3   Ankle plantarflexion      Ankle inversion      Ankle eversion       (Blank rows = not tested)  LOWER EXTREMITY MMT:  MMT Right eval Left eval R/L 09/10/23   Hip flexion     Hip extension  Hip abduction     Hip adduction     Hip internal rotation     Hip external rotation     Knee flexion     Knee extension     Ankle dorsiflexion 5 5   Ankle plantarflexion 5 5   Ankle inversion 4 4 5    /  5  Ankle eversion 5 5    (Blank rows = not tested)  LOWER EXTREMITY SPECIAL TESTS:  DNT  FUNCTIONAL TESTS:  SLS: R - 2 seconds; L - 10 seconds-  09/10/23- no change in SLS 09/10/23: Tandem stance > 30 sec each foot back  GAIT: Distance walked: 82ft Assistive device utilized: None Level of assistance: Complete Independence Comments: decreased gait speed, out toeing, decreased heel strike   TREATMENT: [facility] Adult PT  Treatment:                                                DATE: 09/10/23 Therapeutic Exercise: Slant board stretch gastroc and soleus x 2 each Runners stretch- unable to hold  Step stretch for gastroc with UE support , single leg x 20 sec each- pt reports increased heel pain so disc Neuromuscular re-ed: SLS trials Tandem stance trials  Therapeutic Activity: Ankle MMT inv/Ev Ankle AROM DF     [facility] Adult PT Treatment:                                                DATE: 08/27/2023 Therapeutic Exercise: NuStep lvl 4 UE/LE x 3 min for functional activity tolerance Slant board gastroc stretch 2x30" Slant board soleus stretch 2x30"  Eccentric heel lowering on 2in 2x10 Long sitting calf stretch with strap 2x30" each Longsitting ankle DF/inv/ev 2x10 RTB Manual Therapy: STM to bilateral achilles IASTM to bilateral plantar fasica  [facility] Adult PT Treatment:                                                DATE: 08/13/2023 Therapeutic Exercise: Longsitting calf stretch with towel x 30" each Longsitting ankle inversion x 10 each RTB Towel scrunch x 60" Seated heel raise with ball btwn heels x 5 - 5" hold  PATIENT EDUCATION:  Education details: eval findings, FOTO, HEP, POC Person educated: Patient Education method: Explanation, Demonstration, and Handouts Education comprehension: verbalized understanding and returned demonstration  HOME EXERCISE PROGRAM: Access Code: [email or web address] URL: [email or web address] Date: 08/13/2023 Prepared by: Loral Roch  Exercises - Long Sitting Calf Stretch with Strap  - 1-2 x daily - 7 x weekly - 2-3 sets - 30 sec hold - Towel Scrunches  - 1-2 x daily - 7 x weekly - 2-3 reps - 60" hold - Ankle Inversion with Resistance  - 1-2 x daily - 7 x weekly - 2 sets - 10 reps - red band hold - Seated Calf Raise With Small Ball at Heels  - 1-2 x daily - 7 x weekly - 2 sets - 10 reps - 5 sec hold  ASSESSMENT:  CLINICAL IMPRESSION: ***  EVAL: Patient is a  53 y.o. F who was seen today for physical therapy evaluation  and treatment for chronic bilateral heel pain R>L. Physical findings are consistent with DPM impression as pt demonstrates palpable tenderness and decreased calf length. LEFS score shows severe disability which is a sharp decrease in subjective functional ability below PLOF. Pt would benefit from skilled PT services for multimodal approach towards decreasing pain and improving function.    OBJECTIVE IMPAIRMENTS: Abnormal gait, decreased activity tolerance, decreased balance, decreased mobility, difficulty walking, decreased ROM, decreased strength, and pain  ACTIVITY LIMITATIONS: carrying, lifting, sitting, standing, squatting, stairs, transfers, locomotion level, and caring for others  PARTICIPATION LIMITATIONS: meal prep, cleaning, driving, shopping, community activity, occupation, and yard work  PERSONAL FACTORS: Fitness and Time since onset of injury/illness/exacerbation are also affecting patient's functional outcome.   REHAB POTENTIAL: Good  CLINICAL DECISION MAKING: Evolving/moderate complexity  EVALUATION COMPLEXITY: Moderate   GOALS: Goals reviewed with patient? No  SHORT TERM GOALS: Target date: 09/03/2023   Pt will be compliant and knowledgeable with initial HEP for improved comfort and carryover Baseline: initial HEP given  09/10/23: not as much as I shoulder Goal status: ONGOING  2.  Pt will self report bilateral ankle/foot pain no greater than 6/10 for improved comfort and functional ability Baseline: 8/10 at worst 09/10/23: 6/10 or less last couple of days Goal status: ONGOING   LONG TERM GOALS: Target date: 10/08/2023   Pt will improve LEFS to no less than 35/80 as proxy for functional improvement with home ADLs and higher level community activity Baseline: 22/80 Goal status: INITIAL   2.  Pt will self report bilateral ankle/foot pain no greater than 3/10 for improved comfort and functional ability Baseline:  8/10 at worst Goal status: INITIAL   3.  Pt will improve bilateral ankle DF AROM to no less than 8 degrees for improved functional mobility and decreased foot/ankle pain Baseline: see ROM chart 09/10/23: Right 8 Goal status: PARTIALLY MET  4.  Pt will improve bilateral SLS hold time to no less than 30 seconds as evidence of improved balance and bilateral ankle stability Baseline: R - 2 seconds; L - 10 seconds 09/10/23: no change  Goal status: ONGOING  5.  Pt will improve bilateral ankle inversion MMT to no less than 5/5 with decreased pain for improved arch stability and decrease in pain Baseline: 4/5 09/10/23: 5/5 Goal status: MET  6.  Pt will improve standing activity tolerance to at least 20-30 minutes for improved comfort/tolerance with nanny/caregiving duties Baseline: 5 minutes 09/10/23: 5-10 standing to check in without difficulty Goal status: ONGOING   PLAN:  PT FREQUENCY: 1-2x/week  PT DURATION: 8 weeks  PLANNED INTERVENTIONS: 97164- PT Re-evaluation, 97110-Therapeutic exercises, 97530- Therapeutic activity, 97112- Neuromuscular re-education, 97535- Self Care, 16109- Manual therapy, [id number]- Gait training, [phone]- Aquatic Therapy, U9811- Electrical stimulation (unattended), [id number]- Electrical stimulation (manual), 97016- Vasopneumatic device, Dry Needling, Cryotherapy, and Moist heat  PLAN FOR NEXT SESSION: assess HEP response, progress calf length and ankle strengthening, balance and heel strike with gait   Ivor Mars PT  10/08/23 8:00 AM

## 2023-10-17 ENCOUNTER — Ambulatory Visit (INDEPENDENT_AMBULATORY_CARE_PROVIDER_SITE_OTHER): Admitting: Podiatry

## 2023-10-17 ENCOUNTER — Encounter: Payer: Self-pay | Admitting: Podiatry

## 2023-10-17 DIAGNOSIS — M7662 Achilles tendinitis, left leg: Secondary | ICD-10-CM

## 2023-10-17 DIAGNOSIS — M79673 Pain in unspecified foot: Secondary | ICD-10-CM

## 2023-10-17 DIAGNOSIS — M7661 Achilles tendinitis, right leg: Secondary | ICD-10-CM

## 2023-10-17 NOTE — Progress Notes (Signed)
  Subjective:  Patient ID: Hayley White, female    DOB: June 26, 1970,   MRN: [id number]  Chief Complaint  Patient presents with   Foot Pain    Surgical suite patient here for PRP injection both feet.    53 y.o. female presents for PRP injection   Past Medical History:  Diagnosis Date   ADD (attention deficit disorder)    Bipolar 1 disorder (HCC)    Hiatal hernia    Obesity    Seasonal allergies     Objective:  Physical Exam: Vascular: DP/PT pulses 2/4 bilateral. CFT <3 seconds. Normal hair growth on digits. No edema.  Skin. No lacerations or abrasions bilateral feet.  Musculoskeletal: MMT 5/5 bilateral lower extremities in DF, PF, Inversion and Eversion. Deceased ROM in DF of ankle joint. Tender to posterior calcaneus in the area of the achilles insertion. Minimal pain along watershed area. No pain to medial calcaneal tubercle. No pain with calcaneal squeeze.  Neurological: Sensation intact to light touch.   Assessment:   1. Achilles tendinitis of both lower extremities       Plan:  Patient was evaluated and treated and all questions answered. -Xrays reviewed. No acute fractures or dislocations. Spurring noted to posterior calcaneus bilateral.  -Discussed Achilles insertional tendonitis and treatment options with patient.  -Continue PT.  PRP injection today. Procedure below.   Procedure: Injection Tendon/Ligament Discussed alternatives, risks, complications and verbal consent was obtained.  Location: Bilateral achilles tendon . Skin Prep: Betadine. Injectate: 3 cc of lidocain plain bilateral followed by 3 cc into each achilles of PRP Injectate.   Disposition: Patient tolerated procedure well. Injection site dressed with a band-aid.  Post-injection care was discussed and return precautions discussed.     Jennefer Moats, DPM

## 2023-11-14 ENCOUNTER — Ambulatory Visit (INDEPENDENT_AMBULATORY_CARE_PROVIDER_SITE_OTHER): Admitting: Podiatry

## 2023-11-14 DIAGNOSIS — Z91199 Patient's noncompliance with other medical treatment and regimen due to unspecified reason: Secondary | ICD-10-CM

## 2023-11-14 NOTE — Progress Notes (Signed)
 Cancel 24 hours

## 2023-12-10 ENCOUNTER — Ambulatory Visit (INDEPENDENT_AMBULATORY_CARE_PROVIDER_SITE_OTHER): Admitting: Podiatry

## 2023-12-10 ENCOUNTER — Encounter: Payer: Self-pay | Admitting: Podiatry

## 2023-12-10 DIAGNOSIS — M7662 Achilles tendinitis, left leg: Secondary | ICD-10-CM | POA: Diagnosis not present

## 2023-12-10 DIAGNOSIS — M7661 Achilles tendinitis, right leg: Secondary | ICD-10-CM

## 2023-12-10 NOTE — Progress Notes (Signed)
  Subjective:  Patient ID: Hayley White, female    DOB: 10-Jan-1971,   MRN: [id number]  Chief Complaint  Patient presents with   Routine Post Op    They're doing better.  They're not 100% but they're not as bad as they were.    53 y.o. female presents for follow-up of achilles tendon PRP injection. Relates doing about 50-75% better although she has been out of work for the past month. 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 Denies any other pedal complaints. Denies n/v/f/c.   Past Medical History:  Diagnosis Date   ADD (attention deficit disorder)    Bipolar 1 disorder (HCC)    Hiatal hernia    Obesity    Seasonal allergies     Objective:  Physical Exam: Vascular: DP/PT pulses 2/4 bilateral. CFT <3 seconds. Normal hair growth on digits. No edema.  Skin. No lacerations or abrasions bilateral feet.  Musculoskeletal: MMT 5/5 bilateral lower extremities in DF, PF, Inversion and Eversion. Deceased ROM in DF of ankle joint. Tender to posterior calcaneus in the area of the achilles insertion. Minimal pain along watershed area. No pain to medial calcaneal tubercle. No pain with calcaneal squeeze.  Neurological: Sensation intact to light touch.   Assessment:   1. Achilles tendinitis of both lower extremities        Plan:  Patient was evaluated and treated and all questions answered. -Xrays reviewed. No acute fractures or dislocations. Spurring noted to posterior calcaneus bilateral.  -Discussed Achilles insertional tendonitis and treatment options with patient.  -Continue stretching exercises  -If any worsening will return for possible repeat PRP injection.  -Patient to return to office as needed or sooner if condition worsens.    Asberry Failing, DPM

## 2024-01-29 ENCOUNTER — Other Ambulatory Visit: Payer: Self-pay | Admitting: Internal Medicine

## 2024-01-29 DIAGNOSIS — Z1231 Encounter for screening mammogram for malignant neoplasm of breast: Secondary | ICD-10-CM

## 2024-02-11 ENCOUNTER — Ambulatory Visit
Admission: RE | Admit: 2024-02-11 | Discharge: 2024-02-11 | Disposition: A | Discharge status: Home / Self Care | Source: Ambulatory Visit | Attending: Internal Medicine | Admitting: Internal Medicine

## 2024-02-11 ENCOUNTER — Ambulatory Visit

## 2024-02-11 DIAGNOSIS — Z1231 Encounter for screening mammogram for malignant neoplasm of breast: Secondary | ICD-10-CM
